# Patient Record
Sex: Female | Born: 1975 | Race: White | Hispanic: No | Marital: Married | Smoking: Never smoker
Health system: Southern US, Community
[De-identification: ages and names within clinical notes are randomized; demographics above are authoritative.]

## PROBLEM LIST (undated history)

## (undated) DIAGNOSIS — N6002 Solitary cyst of left breast: Secondary | ICD-10-CM

## (undated) DIAGNOSIS — R7303 Prediabetes: Secondary | ICD-10-CM

## (undated) DIAGNOSIS — D649 Anemia, unspecified: Secondary | ICD-10-CM

## (undated) DIAGNOSIS — R569 Unspecified convulsions: Secondary | ICD-10-CM

## (undated) HISTORY — PX: ABDOMINAL HYSTERECTOMY: SHX81

## (undated) HISTORY — PX: TUBAL LIGATION: SHX77

## (undated) HISTORY — DX: Solitary cyst of left breast: N60.02

---

## 2007-07-23 ENCOUNTER — Ambulatory Visit: Payer: Self-pay | Admitting: Obstetrics and Gynecology

## 2007-07-26 ENCOUNTER — Inpatient Hospital Stay: Payer: Self-pay | Admitting: Obstetrics and Gynecology

## 2013-05-20 ENCOUNTER — Ambulatory Visit: Payer: Self-pay | Admitting: Obstetrics and Gynecology

## 2013-05-24 ENCOUNTER — Ambulatory Visit: Payer: Self-pay | Admitting: Obstetrics and Gynecology

## 2013-11-08 ENCOUNTER — Ambulatory Visit: Payer: Self-pay | Admitting: Neurology

## 2014-01-11 ENCOUNTER — Ambulatory Visit: Payer: Self-pay | Admitting: Obstetrics and Gynecology

## 2014-03-08 DIAGNOSIS — N83209 Unspecified ovarian cyst, unspecified side: Secondary | ICD-10-CM | POA: Insufficient documentation

## 2014-03-08 DIAGNOSIS — N926 Irregular menstruation, unspecified: Secondary | ICD-10-CM | POA: Insufficient documentation

## 2014-05-22 ENCOUNTER — Ambulatory Visit: Payer: Self-pay | Admitting: Obstetrics and Gynecology

## 2016-02-05 ENCOUNTER — Ambulatory Visit
Admission: EM | Admit: 2016-02-05 | Discharge: 2016-02-05 | Disposition: A | Discharge status: Home / Self Care | Payer: Self-pay | Attending: Family Medicine | Admitting: Family Medicine

## 2016-02-05 MED ORDER — TUBERCULIN PPD 5 UNIT/0.1ML ID SOLN
5.0000 [IU] | Freq: Once | INTRADERMAL | Status: DC
  Administered 2016-02-05: 5 [IU] via INTRADERMAL

## 2016-02-05 NOTE — ED Notes (Signed)
For PPD Placement-starts working at [employer] March 1st/17.

## 2016-02-08 NOTE — ED Notes (Signed)
PPD read- 0 mm (negative) LFA. Read by this nurse

## 2016-04-25 ENCOUNTER — Other Ambulatory Visit: Payer: Self-pay | Admitting: Obstetrics and Gynecology

## 2016-04-25 DIAGNOSIS — N6459 Other signs and symptoms in breast: Secondary | ICD-10-CM

## 2016-05-05 ENCOUNTER — Ambulatory Visit: Payer: Self-pay

## 2016-05-05 ENCOUNTER — Other Ambulatory Visit: Payer: Self-pay

## 2016-05-13 ENCOUNTER — Ambulatory Visit
Admission: RE | Admit: 2016-05-13 | Discharge: 2016-05-13 | Disposition: A | Discharge status: Home / Self Care | Payer: Managed Care, Other (non HMO) | Source: Ambulatory Visit | Attending: Obstetrics and Gynecology | Admitting: Obstetrics and Gynecology

## 2016-05-13 DIAGNOSIS — N6459 Other signs and symptoms in breast: Secondary | ICD-10-CM

## 2016-05-13 DIAGNOSIS — N63 Unspecified lump in breast: Secondary | ICD-10-CM | POA: Insufficient documentation

## 2016-05-22 ENCOUNTER — Other Ambulatory Visit: Payer: Self-pay | Admitting: Obstetrics and Gynecology

## 2016-05-22 DIAGNOSIS — N63 Unspecified lump in unspecified breast: Secondary | ICD-10-CM

## 2016-11-17 ENCOUNTER — Ambulatory Visit
Admission: RE | Admit: 2016-11-17 | Discharge: 2016-11-17 | Disposition: A | Discharge status: Home / Self Care | Payer: Managed Care, Other (non HMO) | Source: Ambulatory Visit | Attending: Obstetrics and Gynecology | Admitting: Obstetrics and Gynecology

## 2016-11-17 DIAGNOSIS — N6320 Unspecified lump in the left breast, unspecified quadrant: Secondary | ICD-10-CM | POA: Insufficient documentation

## 2016-11-17 DIAGNOSIS — N63 Unspecified lump in unspecified breast: Secondary | ICD-10-CM

## 2016-12-09 ENCOUNTER — Other Ambulatory Visit: Payer: Self-pay | Admitting: Obstetrics and Gynecology

## 2016-12-09 DIAGNOSIS — N6002 Solitary cyst of left breast: Secondary | ICD-10-CM

## 2016-12-22 HISTORY — PX: BREAST CYST ASPIRATION: SHX578

## 2017-05-19 ENCOUNTER — Ambulatory Visit: Payer: Managed Care, Other (non HMO)

## 2017-05-26 ENCOUNTER — Encounter
Admission: RE | Admit: 2017-05-26 | Discharge: 2017-05-26 | Disposition: A | Discharge status: Home / Self Care | Payer: BC Managed Care – PPO | Source: Ambulatory Visit | Attending: Obstetrics and Gynecology | Admitting: Obstetrics and Gynecology

## 2017-05-26 DIAGNOSIS — Z01812 Encounter for preprocedural laboratory examination: Secondary | ICD-10-CM | POA: Insufficient documentation

## 2017-05-26 DIAGNOSIS — N879 Dysplasia of cervix uteri, unspecified: Secondary | ICD-10-CM | POA: Diagnosis not present

## 2017-05-26 DIAGNOSIS — N920 Excessive and frequent menstruation with regular cycle: Secondary | ICD-10-CM | POA: Diagnosis not present

## 2017-05-26 DIAGNOSIS — Z0183 Encounter for blood typing: Secondary | ICD-10-CM | POA: Insufficient documentation

## 2017-05-26 HISTORY — DX: Prediabetes: R73.03

## 2017-05-26 HISTORY — DX: Unspecified convulsions: R56.9

## 2017-05-26 HISTORY — DX: Anemia, unspecified: D64.9

## 2017-05-26 LAB — CBC
HCT: 30.2 % — ABNORMAL LOW (ref 35.0–47.0)
Hemoglobin: 9.2 g/dL — ABNORMAL LOW (ref 12.0–16.0)
MCH: 19 pg — ABNORMAL LOW (ref 26.0–34.0)
MCHC: 30.4 g/dL — ABNORMAL LOW (ref 32.0–36.0)
MCV: 62.4 fL — ABNORMAL LOW (ref 80.0–100.0)
PLATELETS: 408 10*3/uL (ref 150–440)
RBC: 4.83 MIL/uL (ref 3.80–5.20)
RDW: 19.1 % — ABNORMAL HIGH (ref 11.5–14.5)
WBC: 6.2 10*3/uL (ref 3.6–11.0)

## 2017-05-26 LAB — TYPE AND SCREEN
ABO/RH(D): O POS
Antibody Screen: NEGATIVE

## 2017-05-26 LAB — BASIC METABOLIC PANEL
Anion gap: 4 — ABNORMAL LOW (ref 5–15)
BUN: 7 mg/dL (ref 6–20)
CALCIUM: 8.9 mg/dL (ref 8.9–10.3)
CO2: 28 mmol/L (ref 22–32)
CREATININE: 0.62 mg/dL (ref 0.44–1.00)
Chloride: 106 mmol/L (ref 101–111)
GFR calc non Af Amer: >60 mL/min (ref >60)
Glucose, Bld: 89 mg/dL (ref 65–99)
Potassium: 3.9 mmol/L (ref 3.5–5.1)
SODIUM: 138 mmol/L (ref 135–145)

## 2017-05-26 NOTE — H&P (Signed)
[name] is a 41 y.o. female here for TAh and bilateral salpingectomy . Marland KitchenThe patient has a long h/o bleeding for 10 days / month and passes clots . SIS and EMBX done 04/2016 all nl . Pt with cervical dysplasia cin1 . She is s/p 2 c/s and no vag deliveries . Intermittent dyspareunia . Also with recurrent vaginal  monilial infections . Treated last time with 6 months of diflucan . Borderline diabetic  Past Medical History:  has a past medical history of Abnormal Pap smear of cervix; Atypical squamous cells of undetermined significance (ASCUS) on Papanicolaou smear of cervix (05/06/2011); Cervical dysplasia; Dysplasia of cervix, low grade (CIN 1); History of anemia; History of miscarriage; History of seizure disorder; History of UTI; HSV infection; Irregular menstrual cycle, unspecified (03/08/2014); Other and unspecified ovarian cyst (03/08/2014); and Prediabetes.  Past Surgical History:  has a past surgical history that includes Tubal ligation; Colposcopy; and Cesarean section (2003, 2008). Family History: family history includes Celiac disease in her father; Diabetes in her mother and other; High blood pressure (Hypertension) in her mother; Hyperlipidemia (Elevated cholesterol) in her mother; Ovarian cancer in her mother; Urinary Incontinence in her mother. Social History:  reports that she has never smoked. She has never used smokeless tobacco. She reports that she does not drink alcohol or use drugs. OB/GYN History:          OB History    Gravida Para Term Preterm AB Living   3 2 2   1 2    SAB TAB Ectopic Molar Multiple Live Births   1                Allergies: is allergic to iodinated contrast- oral and iv dye. Medications:  Current Outpatient Prescriptions:  .  fluconazole (DIFLUCAN) 150 MG tablet, Take 1 tablet (150 mg total) by mouth once. May repeat tablet in 1 week, Disp: 1 tablet, Rfl: 1 .  fluconazole (DIFLUCAN) 150 MG tablet, Take 1 tablet (150 mg total) by mouth once daily. For  three days., Disp: 3 tablet, Rfl: 0 .  fluconazole (DIFLUCAN) 150 MG tablet, Take 1 tablet by mouth once weekly for 6 months for suppression, Disp: 60 tablet, Rfl: 3  Review of Systems: General:                      No fatigue or weight loss Eyes:                           No vision changes Ears:                            No hearing difficulty Respiratory:                No cough or shortness of breath Pulmonary:                  No asthma or shortness of breath Cardiovascular:           No chest pain, palpitations, dyspnea on exertion Gastrointestinal:          No abdominal bloating, chronic diarrhea, constipations, masses, pain or hematochezia Genitourinary:             No hematuria, dysuria, abnormal vaginal discharge, pelvic pain, Menometrorrhagia Lymphatic:                   No swollen lymph nodes Musculoskeletal:  No muscle weakness Neurologic:                  No extremity weakness, syncope, seizure disorder Psychiatric:                  No history of depression, delusions or suicidal/homicidal ideation    Exam:      Vitals:   04/08/17 1051  BP: 127/70  Pulse: 78    Body mass index is 27.72 kg/m.  WDWN white/  female in NAD   Lungs: CTA  CV : RRR without murmur   Neck:  no thyromegaly Abdomen: soft , no mass, normal active bowel sounds,  non-tender, no rebound tenderness Pelvic: tanner stage 5 ,  External genitalia: vulva /labia no lesions Urethra: no prolapse Vagina: white d/c . Wet mount : + yeast  Cervix: no lesions, no cervical motion tenderness   Uterus: normal size shape and contour, non-tender Adnexa: no mass,  non-tender   Rectovaginal:   Impression:   The primary encounter diagnosis was Menorrhagia with regular cycle. Diagnoses of Leukorrhea, History of cervical dysplasia, and Monilial vaginitis were also pertinent to this visit.( recurrent)    Plan:    I have spoken with the patient regarding treatment options including  expectant management, hormonal options, or surgical intervention. After a full discussion the pt elects to proceed with TAH   Benefits and risks to surgery:TAH , bilateral salpingectomy  The proposed benefit of the surgery has been discussed with the patient. The possible risks include, but are not limited to: organ injury to the bowel , bladder, ureters, and major blood vessels and nerves. There is a possibility of additional surgeries resulting from these injuries. There is also the risk of blood transfusion and the need to receive blood products during or after the procedure which may rarely lead to HIV or Hepatitis C infection. There is a risk of developing a deep venous thrombosis or a pulmonary embolism . There is the possibility of wound infection and also anesthetic complications, even the rare possibility of death. The patient understands these risks and wishes to proceed. All questions have been answered and the consent has been signed.   Orders Placed This Encounter  Procedures  . Yeast Only, Culture - [facility]  . Wet Prep    Return if symptoms worsen or fail to improve, for preop.  Caroline Sauger, MD       Electronically signed by Caroline Sauger, MD

## 2017-05-26 NOTE — Patient Instructions (Signed)
  Your procedure is scheduled on: June 05, 2017 (FRIDAY) Report to Same Day Surgery 2nd floor medical mall ([place] Entrance-take elevator on left to 2nd floor.  Check in with surgery information desk.) To find out your arrival time please call [phone] between 1PM - 3PM on June 04, 2017 (THURSDAY)  Remember: Instructions that are not followed completely may result in serious medical risk, up to and including death, or upon the discretion of your surgeon and anesthesiologist your surgery may need to be rescheduled.    _x___ 1. Do not eat food or drink liquids after midnight. No gum chewing or hard candies                                 __x__ 2. No Alcohol for 24 hours before or after surgery.   __x__3. No Smoking for 24 prior to surgery.   ____  4. Bring all medications with you on the day of surgery if instructed.    __x__ 5. Notify your doctor if there is any change in your medical condition     (cold, fever, infections).     Do not wear jewelry, make-up, hairpins, clips or nail polish.  Do not wear lotions, powders, or perfumes.  Do not shave 48 hours prior to surgery. Men may shave face and neck.  Do not bring valuables to the hospital.    [facility] is not responsible for any belongings or valuables.               Contacts, dentures or bridgework may not be worn into surgery.  Leave your suitcase in the car. After surgery it may be brought to your room.  For patients admitted to the hospital, discharge time is determined by your  treatment team                    Patients discharged the day of surgery will not be allowed to drive home.  You will need someone to drive you home and stay with you the night of your procedure.    Please read over the following fact sheets that you were given:   [facility] Preparing for Surgery and or MRSA Information   ___ Take anti-hypertensive (unless it includes a diuretic), cardiac, seizure, asthma,     anti-reflux and psychiatric  medicines. These include:  1.   2.  3.  4.  5.  6.  _x__Fleets enema or Magnesium Citrate as directed. (FLEET ENEMA EARLY MORNING OF SURGERY PRIOR TO COMING TO HOSPITAL)   _x___ Use CHG Soap or sage wipes as directed on instruction sheet   ____ Use inhalers on the day of surgery and bring to hospital day of surgery  ____ Stop Metformin and Janumet 2 days prior to surgery.    ____ Take 1/2 of usual insulin dose the night before surgery and none on the morning surgery     _x___ Follow recommendations from Cardiologist, Pulmonologist or PCP regarding          stopping Aspirin, Coumadin, Plavix ,Eliquis, Effient, or Pradaxa, and Pletal.  X____Stop Anti-inflammatories such as Advil, Aleve, Ibuprofen, Motrin, Naproxen, Naprosyn, Goodies powders or aspirin products. OK to take Tylenol   _x___ Stop supplements until after surgery.  But may continue Vitamin D, Vitamin B, and multivitamin      ____ Bring C-Pap to the hospital.

## 2017-06-03 ENCOUNTER — Other Ambulatory Visit: Payer: Self-pay | Admitting: Obstetrics and Gynecology

## 2017-06-03 ENCOUNTER — Ambulatory Visit
Admission: RE | Admit: 2017-06-03 | Discharge: 2017-06-03 | Disposition: A | Discharge status: Home / Self Care | Payer: BC Managed Care – PPO | Source: Ambulatory Visit | Attending: Obstetrics and Gynecology | Admitting: Obstetrics and Gynecology

## 2017-06-03 ENCOUNTER — Ambulatory Visit: Payer: Managed Care, Other (non HMO)

## 2017-06-03 DIAGNOSIS — N6002 Solitary cyst of left breast: Secondary | ICD-10-CM

## 2017-06-05 ENCOUNTER — Encounter
Admission: RE | Disposition: A | Discharge status: Home / Self Care | Payer: Self-pay | Source: Ambulatory Visit | Attending: Obstetrics and Gynecology

## 2017-06-05 ENCOUNTER — Inpatient Hospital Stay: Payer: BC Managed Care – PPO | Admitting: Anesthesiology

## 2017-06-05 ENCOUNTER — Inpatient Hospital Stay
Admission: RE | Admit: 2017-06-05 | Discharge: 2017-06-08 | DRG: 743 | Disposition: A | Discharge status: Home / Self Care | Payer: BC Managed Care – PPO | Source: Ambulatory Visit | Attending: Obstetrics and Gynecology | Admitting: Obstetrics and Gynecology

## 2017-06-05 ENCOUNTER — Encounter: Payer: Self-pay | Admitting: *Deleted

## 2017-06-05 DIAGNOSIS — N920 Excessive and frequent menstruation with regular cycle: Secondary | ICD-10-CM | POA: Diagnosis present

## 2017-06-05 DIAGNOSIS — N898 Other specified noninflammatory disorders of vagina: Secondary | ICD-10-CM | POA: Diagnosis present

## 2017-06-05 DIAGNOSIS — N879 Dysplasia of cervix uteri, unspecified: Secondary | ICD-10-CM | POA: Diagnosis present

## 2017-06-05 DIAGNOSIS — Z9889 Other specified postprocedural states: Secondary | ICD-10-CM

## 2017-06-05 HISTORY — PX: HYSTERECTOMY ABDOMINAL WITH SALPINGECTOMY: SHX6725

## 2017-06-05 LAB — GLUCOSE, CAPILLARY: Glucose-Capillary: 88 mg/dL (ref 65–99)

## 2017-06-05 LAB — ABO/RH: ABO/RH(D): O POS

## 2017-06-05 LAB — POCT PREGNANCY, URINE: PREG TEST UR: NEGATIVE

## 2017-06-05 SURGERY — HYSTERECTOMY, TOTAL, ABDOMINAL, WITH SALPINGECTOMY
Anesthesia: General | Laterality: Bilateral

## 2017-06-05 MED ORDER — GLYCOPYRROLATE 0.2 MG/ML IJ SOLN
INTRAMUSCULAR | Status: DC | PRN
  Administered 2017-06-05 (×2): 0.1 mg via INTRAVENOUS

## 2017-06-05 MED ORDER — KETOROLAC TROMETHAMINE 30 MG/ML IJ SOLN
INTRAMUSCULAR | Status: DC | PRN
  Administered 2017-06-05: 15 mg via INTRAVENOUS

## 2017-06-05 MED ORDER — DEXAMETHASONE SODIUM PHOSPHATE 10 MG/ML IJ SOLN
INTRAMUSCULAR | Status: AC
  Filled 2017-06-05: qty 1

## 2017-06-05 MED ORDER — HYDROMORPHONE HCL 1 MG/ML IJ SOLN
0.5000 mg | INTRAMUSCULAR | Status: AC | PRN
  Administered 2017-06-05 (×2): 0.5 mg via INTRAVENOUS

## 2017-06-05 MED ORDER — FENTANYL CITRATE (PF) 100 MCG/2ML IJ SOLN
INTRAMUSCULAR | Status: AC
  Filled 2017-06-05: qty 2

## 2017-06-05 MED ORDER — MORPHINE SULFATE (PF) 2 MG/ML IV SOLN: 1.0000 mg | INTRAVENOUS | Status: DC | PRN

## 2017-06-05 MED ORDER — SUCCINYLCHOLINE CHLORIDE 20 MG/ML IJ SOLN
INTRAMUSCULAR | Status: AC
  Filled 2017-06-05: qty 1

## 2017-06-05 MED ORDER — SODIUM CHLORIDE 0.9 % IJ SOLN
INTRAMUSCULAR | Status: AC
  Filled 2017-06-05: qty 10

## 2017-06-05 MED ORDER — FAMOTIDINE 20 MG PO TABS
ORAL_TABLET | ORAL | Status: AC
  Filled 2017-06-05: qty 1

## 2017-06-05 MED ORDER — PHENYLEPHRINE HCL 10 MG/ML IJ SOLN
INTRAMUSCULAR | Status: AC
  Filled 2017-06-05: qty 1

## 2017-06-05 MED ORDER — BUPIVACAINE HCL (PF) 0.5 % IJ SOLN
INTRAMUSCULAR | Status: DC | PRN
  Administered 2017-06-05: 30 mL
  Administered 2017-06-05: 20 mL

## 2017-06-05 MED ORDER — ONDANSETRON HCL 4 MG/2ML IJ SOLN
INTRAMUSCULAR | Status: AC
  Filled 2017-06-05: qty 2

## 2017-06-05 MED ORDER — FAMOTIDINE 20 MG PO TABS
20.0000 mg | ORAL_TABLET | Freq: Once | ORAL | Status: AC
  Administered 2017-06-05: 20 mg via ORAL

## 2017-06-05 MED ORDER — PROPOFOL 10 MG/ML IV BOLUS
INTRAVENOUS | Status: AC
  Filled 2017-06-05: qty 20

## 2017-06-05 MED ORDER — DEXAMETHASONE SODIUM PHOSPHATE 10 MG/ML IJ SOLN
INTRAMUSCULAR | Status: DC | PRN
  Administered 2017-06-05: 5 mg via INTRAVENOUS

## 2017-06-05 MED ORDER — OXYCODONE-ACETAMINOPHEN 5-325 MG PO TABS
1.0000 | ORAL_TABLET | ORAL | Status: DC | PRN
Start: 2017-06-05 — End: 2017-06-08
  Administered 2017-06-05 – 2017-06-07 (×4): 1 via ORAL
  Filled 2017-06-05: qty 2
  Filled 2017-06-05 (×3): qty 1

## 2017-06-05 MED ORDER — SIMETHICONE 80 MG PO CHEW
80.0000 mg | CHEWABLE_TABLET | Freq: Four times a day (QID) | ORAL | Status: DC | PRN
Start: 2017-06-05 — End: 2017-06-08
  Administered 2017-06-05 – 2017-06-07 (×7): 80 mg via ORAL
  Filled 2017-06-05 (×7): qty 1

## 2017-06-05 MED ORDER — FENTANYL CITRATE (PF) 100 MCG/2ML IJ SOLN
INTRAMUSCULAR | Status: AC
  Administered 2017-06-05: 25 ug via INTRAVENOUS
  Filled 2017-06-05: qty 2

## 2017-06-05 MED ORDER — BUPIVACAINE LIPOSOME 1.3 % IJ SUSP
INTRAMUSCULAR | Status: AC
  Filled 2017-06-05: qty 20

## 2017-06-05 MED ORDER — ROCURONIUM BROMIDE 50 MG/5ML IV SOLN
INTRAVENOUS | Status: AC
  Filled 2017-06-05: qty 1

## 2017-06-05 MED ORDER — EPHEDRINE SULFATE 50 MG/ML IJ SOLN
INTRAMUSCULAR | Status: AC
  Filled 2017-06-05: qty 1

## 2017-06-05 MED ORDER — HYDROMORPHONE HCL 1 MG/ML PO LIQD: 0.5000 mg | ORAL | Status: DC | PRN

## 2017-06-05 MED ORDER — LACTATED RINGERS IV SOLN
INTRAVENOUS | Status: DC
  Administered 2017-06-05 (×2): via INTRAVENOUS

## 2017-06-05 MED ORDER — ONDANSETRON HCL 4 MG/2ML IJ SOLN: 4.0000 mg | Freq: Once | INTRAMUSCULAR | Status: DC | PRN

## 2017-06-05 MED ORDER — ONDANSETRON HCL 4 MG/2ML IJ SOLN
4.0000 mg | Freq: Four times a day (QID) | INTRAMUSCULAR | Status: DC | PRN
  Administered 2017-06-05 (×2): 4 mg via INTRAVENOUS
  Filled 2017-06-05 (×2): qty 2

## 2017-06-05 MED ORDER — LIDOCAINE HCL (CARDIAC) 20 MG/ML IV SOLN
INTRAVENOUS | Status: DC | PRN
  Administered 2017-06-05: 100 mg via INTRAVENOUS

## 2017-06-05 MED ORDER — CEFOXITIN SODIUM-DEXTROSE 2-2.2 GM-% IV SOLR (PREMIX)
INTRAVENOUS | Status: AC
  Filled 2017-06-05: qty 50

## 2017-06-05 MED ORDER — FENTANYL CITRATE (PF) 100 MCG/2ML IJ SOLN
INTRAMUSCULAR | Status: DC | PRN
  Administered 2017-06-05 (×4): 50 ug via INTRAVENOUS

## 2017-06-05 MED ORDER — GLYCOPYRROLATE 0.2 MG/ML IJ SOLN
INTRAMUSCULAR | Status: AC
  Filled 2017-06-05: qty 1

## 2017-06-05 MED ORDER — SUGAMMADEX SODIUM 200 MG/2ML IV SOLN
INTRAVENOUS | Status: AC
  Filled 2017-06-05: qty 2

## 2017-06-05 MED ORDER — SODIUM CHLORIDE 0.9 % IV SOLN
INTRAVENOUS | Status: DC | PRN
  Administered 2017-06-05: 70 mL

## 2017-06-05 MED ORDER — FLEET ENEMA 7-19 GM/118ML RE ENEM: 1.0000 | ENEMA | Freq: Once | RECTAL | Status: DC

## 2017-06-05 MED ORDER — ACETAMINOPHEN 10 MG/ML IV SOLN
INTRAVENOUS | Status: DC | PRN
Start: 2017-06-05 — End: 2017-06-05
  Administered 2017-06-05: 1000 mg via INTRAVENOUS

## 2017-06-05 MED ORDER — SEVOFLURANE IN SOLN
RESPIRATORY_TRACT | Status: AC
  Filled 2017-06-05: qty 250

## 2017-06-05 MED ORDER — SODIUM CHLORIDE 0.9% FLUSH: 3.0000 mL | INTRAVENOUS | Status: DC | PRN

## 2017-06-05 MED ORDER — PROPOFOL 10 MG/ML IV BOLUS
INTRAVENOUS | Status: DC | PRN
  Administered 2017-06-05: 150 mg via INTRAVENOUS

## 2017-06-05 MED ORDER — MIDAZOLAM HCL 2 MG/2ML IJ SOLN
INTRAMUSCULAR | Status: AC
  Filled 2017-06-05: qty 2

## 2017-06-05 MED ORDER — SUGAMMADEX SODIUM 200 MG/2ML IV SOLN
INTRAVENOUS | Status: DC | PRN
  Administered 2017-06-05: 100 mg via INTRAVENOUS

## 2017-06-05 MED ORDER — KETOROLAC TROMETHAMINE 30 MG/ML IJ SOLN
INTRAMUSCULAR | Status: AC
  Filled 2017-06-05: qty 1

## 2017-06-05 MED ORDER — FENTANYL CITRATE (PF) 100 MCG/2ML IJ SOLN
25.0000 ug | INTRAMUSCULAR | Status: AC | PRN
  Administered 2017-06-05 (×6): 25 ug via INTRAVENOUS

## 2017-06-05 MED ORDER — SODIUM CHLORIDE 0.9 % IJ SOLN
INTRAMUSCULAR | Status: AC
  Filled 2017-06-05: qty 50

## 2017-06-05 MED ORDER — CEFOXITIN SODIUM-DEXTROSE 2-2.2 GM-% IV SOLR (PREMIX)
2.0000 g | INTRAVENOUS | Status: AC
  Administered 2017-06-05: 2 g via INTRAVENOUS

## 2017-06-05 MED ORDER — ROCURONIUM BROMIDE 100 MG/10ML IV SOLN
INTRAVENOUS | Status: DC | PRN
  Administered 2017-06-05: 20 mg via INTRAVENOUS
  Administered 2017-06-05: 30 mg via INTRAVENOUS

## 2017-06-05 MED ORDER — BUPIVACAINE HCL (PF) 0.5 % IJ SOLN
INTRAMUSCULAR | Status: AC
  Filled 2017-06-05: qty 30

## 2017-06-05 MED ORDER — MIDAZOLAM HCL 2 MG/2ML IJ SOLN
INTRAMUSCULAR | Status: DC | PRN
  Administered 2017-06-05: 2 mg via INTRAVENOUS

## 2017-06-05 MED ORDER — KETOROLAC TROMETHAMINE 30 MG/ML IJ SOLN
30.0000 mg | Freq: Three times a day (TID) | INTRAMUSCULAR | Status: AC | PRN
  Administered 2017-06-05 – 2017-06-06 (×3): 30 mg via INTRAVENOUS
  Filled 2017-06-05 (×3): qty 1

## 2017-06-05 MED ORDER — EPHEDRINE SULFATE 50 MG/ML IJ SOLN
INTRAMUSCULAR | Status: DC | PRN
  Administered 2017-06-05: 5 mg via INTRAVENOUS
  Administered 2017-06-05: 10 mg via INTRAVENOUS

## 2017-06-05 MED ORDER — ONDANSETRON HCL 4 MG PO TABS
4.0000 mg | ORAL_TABLET | Freq: Four times a day (QID) | ORAL | Status: DC | PRN
  Administered 2017-06-05: 4 mg via ORAL
  Filled 2017-06-05: qty 1

## 2017-06-05 MED ORDER — LIDOCAINE HCL (PF) 2 % IJ SOLN
INTRAMUSCULAR | Status: AC
  Filled 2017-06-05: qty 2

## 2017-06-05 MED ORDER — HYDROMORPHONE HCL 1 MG/ML IJ SOLN
INTRAMUSCULAR | Status: AC
  Administered 2017-06-05: 0.5 mg via INTRAVENOUS
  Filled 2017-06-05: qty 1

## 2017-06-05 MED ORDER — LACTATED RINGERS IV SOLN
INTRAVENOUS | Status: DC
  Administered 2017-06-05: 07:00:00 via INTRAVENOUS

## 2017-06-05 MED ORDER — ONDANSETRON HCL 4 MG/2ML IJ SOLN
INTRAMUSCULAR | Status: DC | PRN
  Administered 2017-06-05: 4 mg via INTRAVENOUS

## 2017-06-05 MED ORDER — BUTORPHANOL TARTRATE 2 MG/ML IJ SOLN: 2.0000 mg | INTRAMUSCULAR | Status: DC | PRN

## 2017-06-05 SURGICAL SUPPLY — 42 items
CANISTER SUCT 1200ML W/VALVE (MISCELLANEOUS) ×3 IMPLANT
CATH TRAY 16F METER LATEX (MISCELLANEOUS) ×3 IMPLANT
CHLORAPREP W/TINT 26ML (MISCELLANEOUS) ×3 IMPLANT
DRAPE LAP W/FLUID (DRAPES) ×3 IMPLANT
DRAPE UNDER BUTTOCK W/FLU (DRAPES) ×3 IMPLANT
DRSG TELFA 3X8 NADH (GAUZE/BANDAGES/DRESSINGS) ×3 IMPLANT
ELECT BLADE 6.5 EXT (BLADE) ×3 IMPLANT
ELECT CAUTERY BLADE 6.4 (BLADE) ×3 IMPLANT
ELECT REM PT RETURN 9FT ADLT (ELECTROSURGICAL) ×3
ELECTRODE REM PT RTRN 9FT ADLT (ELECTROSURGICAL) ×1 IMPLANT
GAUZE SPONGE 4X4 12PLY STRL (GAUZE/BANDAGES/DRESSINGS) ×3 IMPLANT
GLOVE BIO SURGEON STRL SZ7 (GLOVE) ×3 IMPLANT
GLOVE BIO SURGEON STRL SZ8 (GLOVE) ×30 IMPLANT
GLOVE BIOGEL PI IND STRL 6.5 (GLOVE) ×1 IMPLANT
GLOVE BIOGEL PI INDICATOR 6.5 (GLOVE) ×2
GOWN STRL REUS W/ TWL LRG LVL3 (GOWN DISPOSABLE) ×2 IMPLANT
GOWN STRL REUS W/ TWL XL LVL3 (GOWN DISPOSABLE) ×1 IMPLANT
GOWN STRL REUS W/TWL LRG LVL3 (GOWN DISPOSABLE) ×4
GOWN STRL REUS W/TWL XL LVL3 (GOWN DISPOSABLE) ×2
KIT RM TURNOVER CYSTO AR (KITS) ×3 IMPLANT
LABEL OR SOLS (LABEL) ×3 IMPLANT
NEEDLE HYPO 22GX1.5 SAFETY (NEEDLE) ×6 IMPLANT
PACK BASIN MAJOR ARMC (MISCELLANEOUS) ×3 IMPLANT
RETAINER VISCERA MED (MISCELLANEOUS) ×3 IMPLANT
SOL PREP PVP 2OZ (MISCELLANEOUS) ×3
SOLUTION PREP PVP 2OZ (MISCELLANEOUS) ×1 IMPLANT
SPONGE XRAY 4X4 16PLY STRL (MISCELLANEOUS) ×3 IMPLANT
STAPLER INSORB 30 2030 C-SECTI (MISCELLANEOUS) ×3 IMPLANT
STAPLER SKIN PROX 35W (STAPLE) IMPLANT
SURGILUBE 2OZ TUBE FLIPTOP (MISCELLANEOUS) ×3 IMPLANT
SUT CHROMIC 3 0 SH 27 (SUTURE) ×6 IMPLANT
SUT PDS AB 1 TP1 96 (SUTURE) IMPLANT
SUT VIC AB 0 CT1 27 (SUTURE)
SUT VIC AB 0 CT1 27XCR 8 STRN (SUTURE) IMPLANT
SUT VIC AB 0 CT1 36 (SUTURE) ×9 IMPLANT
SUT VIC AB 2-0 SH 27 (SUTURE) ×8
SUT VIC AB 2-0 SH 27XBRD (SUTURE) ×4 IMPLANT
SUT VICRYL PLUS ABS 0 54 (SUTURE) ×3 IMPLANT
SYR 30ML LL (SYRINGE) ×6 IMPLANT
SYR BULB IRRIG 60ML STRL (SYRINGE) ×3 IMPLANT
TRAY PREP VAG/GEN (MISCELLANEOUS) ×3 IMPLANT
WATER STERILE IRR 1000ML POUR (IV SOLUTION) ×3 IMPLANT

## 2017-06-05 NOTE — Anesthesia Postprocedure Evaluation (Signed)
Anesthesia Post Note  Patient: [name]  Procedure(s) Performed: Procedure(s) (LRB): HYSTERECTOMY ABDOMINAL WITH SALPINGECTOMY (Bilateral)  Patient location during evaluation: PACU Anesthesia Type: General Level of consciousness: awake and alert Pain management: pain level controlled Vital Signs Assessment: post-procedure vital signs reviewed and stable Respiratory status: spontaneous breathing and respiratory function stable Cardiovascular status: stable Anesthetic complications: no     Last Vitals:  Vitals:   06/05/17 0954 06/05/17 1010  BP: 111/64 (!) 106/59  Pulse: (!) 57 (!) 53  Resp: (!) 22 (!) 35  Temp:      Last Pain:  Vitals:   06/05/17 1015  TempSrc:   PainSc: 5                  KEPHART,WILLIAM K

## 2017-06-05 NOTE — Op Note (Signed)
NAMEJONALYN, [name]                    ACCOUNT NO.:  [id number]  MEDICAL RECORD NO.:  [id number]  LOCATION:                               FACILITY:  [facility]  PHYSICIAN:  Laverta Baltimore, MDDATE OF BIRTH:  1976-04-20  DATE OF PROCEDURE:  06/05/2017 DATE OF DISCHARGE:                              OPERATIVE REPORT   PREOPERATIVE DIAGNOSIS: 1. Menorrhagia. 2. Cervical dysplasia.  POSTOPERATIVE DIAGNOSIS: 1. Menorrhagia. 2. Cervical dysplasia.  PROCEDURE PERFORMED: 1. Total abdominal hysterectomy. 2. Bilateral salpingectomy.  SURGEON:  Laverta Baltimore, MD  ANESTHESIA:  General endotracheal anesthesia.  SURGEON:  Laverta Baltimore, MD.  FIRST ASSISTANT:  Dr. Leafy Ro.  SECOND ASSISTANT:  [place] student, [place].  DESCRIPTION OF PROCEDURE:  The patient was prepped and draped in a normal sterile fashion.  The patient did receive 2 g IV cefoxitin prior to commencement of the case.  A time-out was performed.  A Pfannenstiel incision was made 2 fingerbreadths above the symphysis pubis.  Sharp dissection was used to identify the fascia.  Fascia was opened in the midline and opened in a transverse fashion.  The superior aspect of the fascia was grasped with Kocher clamps and the recti muscles dissected free.  The inferior aspect of the fascia was grasped with Kocher clamps and pyramidalis muscle was dissected free.  Entry into the peritoneal cavity was accomplished sharply.  An O'Connor-O'Sullivan retractor was placed into the abdomen and the bowel was packed cephalad.  Two large Kelly clamps were placed on the cornua and were used for retraction. The round ligaments on both sides were clamped, transected, and suture ligated with 0 Vicryl suture.  Anterior leaf of the broad ligament was incised along the bladder reflection to the midline from both sides. The bladder was gently dissected off the lower uterine segment and the cervix with sharp dissection and utilizing a sponge stick.   The utero- ovarian ligament was then clamped bilaterally, transected, and suture ligated with 0 Vicryl suture.  Uterine arteries were skeletonized bilaterally, clamped with Haney clamps, transected, and suture ligated with 0 Vicryl suture.  Uterosacral ligaments were clamped on both sides, transected, suture ligated with 0 Vicryl suture.  Curved Haney clamps were then used to remove the cervix.  Pedicles were ligated and the vaginal cuff was closed with interrupted 0 Vicryl suture.  Good hemostasis was noted.  Each fallopian tube was grasped and clamped with Haney clamps and each fallopian tube was removed and the pedicles were ligated with 0 Vicryl suture.  Ureters were identified prior to commencement of the case and at the end of the case with good peristaltic activity.  The patient's abdomen was copiously irrigated. No active bleeding noted.  All tagged suture pedicles were removed.  All laparotomy sponges were removed.  Sponge and needle counts were correct. The fascia was then closed with 0 Vicryl suture in a running nonlocking fashion.  The fascial edge was then injected with a solution of 20 mL of 1.3% Exparel with 30 mL of 0.5% Marcaine and 50 mL of normal saline, 60 mL was used to inject the fascial line.  Subcutaneous tissues were irrigated and bovied and given the depth of  the subcutaneous tissue of 3 cm, the dead space was closed with a running 3-0 chromic suture.  The skin was reapproximated with Insorb absorbable staples.  There were no complications.  The patient tolerated the procedure well.  ESTIMATED BLOOD LOSS:  100 mL.  URINE OUTPUT:  500 mL.  INTRAOPERATIVE FLUIDS:  1000 mL.  DISPOSITION:  The patient was taken to the recovery room in good condition.          ______________________________ Laverta Baltimore, MD     TS/MEDQ  D:  06/05/2017  T:  06/05/2017  Job:  [id number]

## 2017-06-05 NOTE — Progress Notes (Signed)
Patient ID: [name], female   DOB: [birth year], 41 y.o.   MRN: [id number] DOS   small po clears  Some nausea O: VSS Perineum : no blood  Good urine output  A: Stable  P: d/c foley  Cont IV  Add Iv stadol prn

## 2017-06-05 NOTE — Progress Notes (Signed)
Pt is ready for TAH / bilateral salpingectomy. NPO . Labs nl  All questions answered

## 2017-06-05 NOTE — Brief Op Note (Signed)
06/05/2017 DOS :  06/05/17 9:31 AM  PATIENT:  Olena Mater Dechaine  41 y.o. female  PRE-OPERATIVE DIAGNOSIS:  Menorrhagia  Cervical Dysplasia  POST-OPERATIVE DIAGNOSIS:  Menorrhagia  Cervical Dysplasia  PROCEDURE:  Procedure(s): HYSTERECTOMY ABDOMINAL WITH SALPINGECTOMY (Bilateral)  SURGEON:  Surgeon(s) and Role:    * Schermerhorn, Gwen Her, MD - Primary    * Benjaman Kindler, MD  PHYSICIAN ASSISTANT: Carolynn Serve, [place] student   ASSISTANTS: none   ANESTHESIA:   general  EBL:  Total I/O In: 1000 [I.V.:1000] Out: 600 [Urine:500; Blood:100]  BLOOD ADMINISTERED:none  DRAINS: Urinary Catheter (Foley)   LOCAL MEDICATIONS USED:  MARCAINE   , BUPIVICAINE , NS  Amount: 100 ml  SPECIMEN:  Source of Specimen:  cervix, uterus and bilateral fallopian tubes   DISPOSITION OF SPECIMEN:  PATHOLOGY  COUNTS:  YES  TOURNIQUET:  * No tourniquets in log *  DICTATION: .Other Dictation: Dictation Number verbal  PLAN OF CARE: Admit to inpatient   PATIENT DISPOSITION:  PACU - hemodynamically stable.   Delay start of Pharmacological VTE agent (>24hrs) due to surgical blood loss or risk of bleeding: not applicable

## 2017-06-05 NOTE — Anesthesia Procedure Notes (Signed)
Procedure Name: Intubation Date/Time: 06/05/2017 7:37 AM Performed by: Zetta Bills Pre-anesthesia Checklist: Patient identified, Emergency Drugs available, Suction available and Patient being monitored Patient Re-evaluated:Patient Re-evaluated prior to inductionOxygen Delivery Method: Circle system utilized Preoxygenation: Pre-oxygenation with 100% oxygen Intubation Type: IV induction Ventilation: Mask ventilation without difficulty Laryngoscope Size: Mac and 3 Grade View: Grade II Tube type: Oral Tube size: 7.0 mm Number of attempts: 1 Airway Equipment and Method: Stylet Placement Confirmation: ETT inserted through vocal cords under direct vision Secured at: 20 cm Tube secured with: Tape Dental Injury: Teeth and Oropharynx as per pre-operative assessment

## 2017-06-05 NOTE — Anesthesia Preprocedure Evaluation (Signed)
Anesthesia Evaluation  Patient identified by MRN, date of birth, ID band Patient awake    Reviewed: Allergy & Precautions, NPO status , Patient's Chart, lab work & pertinent test results  History of Anesthesia Complications (+) PONV and history of anesthetic complications  Airway Mallampati: II       Dental   Pulmonary neg pulmonary ROS,           Cardiovascular negative cardio ROS       Neuro/Psych Seizures - (none for 24 years),     GI/Hepatic negative GI ROS, Neg liver ROS,   Endo/Other  negative endocrine ROS  Renal/GU negative Renal ROS     Musculoskeletal   Abdominal   Peds  Hematology  (+) anemia ,   Anesthesia Other Findings   Reproductive/Obstetrics                             Anesthesia Physical Anesthesia Plan  ASA: II  Anesthesia Plan: General   Post-op Pain Management:    Induction:   PONV Risk Score and Plan: 4 or greater and Ondansetron, Dexamethasone, Propofol, Midazolam and Scopolamine patch - Pre-op  Airway Management Planned: Oral ETT  Additional Equipment:   Intra-op Plan:   Post-operative Plan:   Informed Consent: I have reviewed the patients History and Physical, chart, labs and discussed the procedure including the risks, benefits and alternatives for the proposed anesthesia with the patient or authorized representative who has indicated his/her understanding and acceptance.     Plan Discussed with:   Anesthesia Plan Comments:         Anesthesia Quick Evaluation

## 2017-06-05 NOTE — Transfer of Care (Signed)
Immediate Anesthesia Transfer of Care Note  Patient: [name]  Procedure(s) Performed: Procedure(s): HYSTERECTOMY ABDOMINAL WITH SALPINGECTOMY (Bilateral)  Patient Location: PACU  Anesthesia Type:General  Level of Consciousness: awake  Airway & Oxygen Therapy: Patient connected to nasal cannula oxygen  Post-op Assessment: Report given to RN and Post -op Vital signs reviewed and stable  Post vital signs: stable  Last Vitals:  Vitals:   06/05/17 0628 06/05/17 0939  BP: 117/69   Pulse: 70   Resp: 16   Temp: 36.9 C (P) 36.4 C    Last Pain:  Vitals:   06/05/17 0628  TempSrc: Oral         Complications: No apparent anesthesia complications

## 2017-06-05 NOTE — Progress Notes (Signed)
Out of dept.

## 2017-06-05 NOTE — Anesthesia Post-op Follow-up Note (Cosign Needed)
Anesthesia QCDR form completed.        

## 2017-06-05 NOTE — Op Note (Deleted)
  The note originally documented on this encounter has been moved the the encounter in which it belongs.  

## 2017-06-06 LAB — CBC
HCT: 26.3 % — ABNORMAL LOW (ref 35.0–47.0)
Hemoglobin: 8.3 g/dL — ABNORMAL LOW (ref 12.0–16.0)
MCH: 19.5 pg — AB (ref 26.0–34.0)
MCHC: 31.8 g/dL — ABNORMAL LOW (ref 32.0–36.0)
MCV: 61.4 fL — AB (ref 80.0–100.0)
PLATELETS: 415 10*3/uL (ref 150–440)
RBC: 4.28 MIL/uL (ref 3.80–5.20)
RDW: 19.8 % — AB (ref 11.5–14.5)
WBC: 8.9 10*3/uL (ref 3.6–11.0)

## 2017-06-06 LAB — BASIC METABOLIC PANEL
Anion gap: 4 — ABNORMAL LOW (ref 5–15)
BUN: 7 mg/dL (ref 6–20)
CO2: 26 mmol/L (ref 22–32)
CREATININE: 0.68 mg/dL (ref 0.44–1.00)
Calcium: 8.6 mg/dL — ABNORMAL LOW (ref 8.9–10.3)
Chloride: 109 mmol/L (ref 101–111)
GFR calc Af Amer: >60 mL/min (ref >60)
GLUCOSE: 103 mg/dL — AB (ref 65–99)
POTASSIUM: 4.2 mmol/L (ref 3.5–5.1)
SODIUM: 139 mmol/L (ref 135–145)

## 2017-06-06 MED ORDER — IBUPROFEN 800 MG PO TABS
800.0000 mg | ORAL_TABLET | Freq: Three times a day (TID) | ORAL | Status: DC | PRN
  Administered 2017-06-06 – 2017-06-07 (×4): 800 mg via ORAL
  Filled 2017-06-06 (×4): qty 1

## 2017-06-06 NOTE — Progress Notes (Signed)
1 Day Post-Op Procedure(s) (LRB): HYSTERECTOMY ABDOMINAL WITH SALPINGECTOMY (Bilateral)  Subjective: Patient reports incisional pain and no problems voiding.   No flatus  Objective: I have reviewed patient's vital signs, intake and output and medications.  Lungs CTA  Cv RRR  adb soft , non distended ,  + BS  inicision covered  Results for orders placed or performed during the hospital encounter of 06/05/17 (from the past 24 hour(s))  CBC     Status: Abnormal   Collection Time: 06/06/17  5:35 AM  Result Value Ref Range   WBC 8.9 3.6 - 11.0 K/uL   RBC 4.28 3.80 - 5.20 MIL/uL   Hemoglobin 8.3 (L) 12.0 - 16.0 g/dL   HCT 26.3 (L) 35.0 - 47.0 %   MCV 61.4 (L) 80.0 - 100.0 fL   MCH 19.5 (L) 26.0 - 34.0 pg   MCHC 31.8 (L) 32.0 - 36.0 g/dL   RDW 19.8 (H) 11.5 - 14.5 %   Platelets 415 150 - 440 K/uL  Basic metabolic panel     Status: Abnormal   Collection Time: 06/06/17  5:35 AM  Result Value Ref Range   Sodium 139 135 - 145 mmol/L   Potassium 4.2 3.5 - 5.1 mmol/L   Chloride 109 101 - 111 mmol/L   CO2 26 22 - 32 mmol/L   Glucose, Bld 103 (H) 65 - 99 mg/dL   BUN 7 6 - 20 mg/dL   Creatinine, Ser 0.68 0.44 - 1.00 mg/dL   Calcium 8.6 (L) 8.9 - 10.3 mg/dL   GFR calc non Af Amer >60 >60 mL/min   GFR calc Af Amer >60 >60 mL/min   Anion gap 4 (L) 5 - 15    Assessment: s/p Procedure(s): HYSTERECTOMY ABDOMINAL WITH SALPINGECTOMY (Bilateral): stable  Plan: full liquid diet   D/c IV   LOS: 1 day    Gwen Her Shauntelle Jamerson 06/06/2017, 11:53 AM

## 2017-06-07 NOTE — Progress Notes (Signed)
2 Days Post-Op Procedure(s) (LRB): HYSTERECTOMY ABDOMINAL WITH SALPINGECTOMY (Bilateral) Pt with some abd gas pains . No flatus this am  Full liquid diet  Subjective: Patient reports as above Objective: I have reviewed patient's vital signs.  General: alert and cooperative Resp: clear to auscultation bilaterally Cardio: regular rate and rhythm, S1, S2 normal, no murmur, click, rub or gallop GI: soft, non-tender; bowel sounds normal; no masses,  no organomegaly incision c/d/I  Assessment: s/p Procedure(s): HYSTERECTOMY ABDOMINAL WITH SALPINGECTOMY (Bilateral): stable Awaiting return of bowel function  Plan: Advance diet if flatus  Pt may shower   LOS: 2 days    Gwen Her Donnamaria Shands 06/07/2017, 7:58 AM

## 2017-06-08 ENCOUNTER — Other Ambulatory Visit: Payer: Self-pay | Admitting: Obstetrics and Gynecology

## 2017-06-08 DIAGNOSIS — R928 Other abnormal and inconclusive findings on diagnostic imaging of breast: Secondary | ICD-10-CM

## 2017-06-08 DIAGNOSIS — N6002 Solitary cyst of left breast: Secondary | ICD-10-CM

## 2017-06-08 MED ORDER — ONDANSETRON HCL 4 MG PO TABS: 4.0000 mg | ORAL_TABLET | Freq: Four times a day (QID) | ORAL | 0 refills | Status: DC | PRN

## 2017-06-08 MED ORDER — IBUPROFEN 800 MG PO TABS: 800.0000 mg | ORAL_TABLET | Freq: Three times a day (TID) | ORAL | 0 refills | Status: DC | PRN

## 2017-06-08 MED ORDER — HYDROCODONE-ACETAMINOPHEN 5-325 MG PO TABS: 1.0000 | ORAL_TABLET | ORAL | 0 refills | Status: DC | PRN

## 2017-06-08 MED ORDER — SIMETHICONE 80 MG PO CHEW: 80.0000 mg | CHEWABLE_TABLET | Freq: Four times a day (QID) | ORAL | 0 refills | Status: DC | PRN

## 2017-06-08 MED ORDER — DOCUSATE SODIUM 100 MG PO CAPS: 100.0000 mg | ORAL_CAPSULE | Freq: Two times a day (BID) | ORAL | 0 refills | Status: DC | PRN

## 2017-06-08 MED ORDER — DOCUSATE SODIUM 100 MG PO CAPS: 100.0000 mg | ORAL_CAPSULE | Freq: Two times a day (BID) | ORAL | Status: DC | PRN

## 2017-06-08 NOTE — Progress Notes (Signed)
Discharge instructions reviewed with patient.  All questions answered.  Follow up appointment scheduled.  

## 2017-06-08 NOTE — Discharge Summary (Signed)
Physician Discharge Summary  Patient ID: [name] MRN: [id number] DOB/AGE: [birth year] 41 y.o.  Admit date: 06/05/2017 Discharge date: 06/08/2017  Admission Diagnoses :menorrhagia  Discharge Diagnoses: same  Active Problems:   Post-operative state   Discharged Condition: good  Hospital Course: pt underwent an uncomplicated TAH bilateral salpingectomy . Uncomplicated post op course  Tolerating regular food on d/c  Consults: None  Significant Diagnostic Studies: none Treatments: surgery: TAH , bilateral salpingectomy   Discharge Exam: Blood pressure (!) 111/46, pulse 66, temperature 97.8 F (36.6 C), temperature source Oral, resp. rate 14, height 5\' 4"  (1.626 m), weight 72.6 kg (160 lb), last menstrual period 05/31/2017, SpO2 100 %. General appearance: alert and cooperative Head: Normocephalic, without obvious abnormality, atraumatic Resp: clear to auscultation bilaterally Cardio: regular rate and rhythm, S1, S2 normal, no murmur, click, rub or gallop GI: soft, non-tender; bowel sounds normal; no masses,  no organomegaly Incision : C/D/I  Disposition: 01-Home or Self Care  Discharge Instructions    Call MD for:  difficulty breathing, headache or visual disturbances    Complete by:  As directed    Call MD for:  extreme fatigue    Complete by:  As directed    Call MD for:  hives    Complete by:  As directed    Call MD for:  persistant dizziness or light-headedness    Complete by:  As directed    Call MD for:  persistant nausea and vomiting    Complete by:  As directed    Call MD for:  redness, tenderness, or signs of infection (pain, swelling, redness, odor or green/yellow discharge around incision site)    Complete by:  As directed    Call MD for:  severe uncontrolled pain    Complete by:  As directed    Call MD for:  temperature >100.4    Complete by:  As directed    Diet - low sodium heart healthy    Complete by:  As directed    Increase activity slowly    Complete  by:  As directed      Allergies as of 06/08/2017   No Known Allergies     Medication List    STOP taking these medications   fluconazole 150 MG tablet Commonly known as:  DIFLUCAN     TAKE these medications   docusate sodium 100 MG capsule Commonly known as:  COLACE Take 1 capsule (100 mg total) by mouth 2 (two) times daily as needed for mild constipation.   HYDROcodone-acetaminophen 5-325 MG tablet Commonly known as:  NORCO Take 1 tablet by mouth every 4 (four) hours as needed for moderate pain.   ibuprofen 800 MG tablet Commonly known as:  ADVIL,MOTRIN Take 1 tablet (800 mg total) by mouth 3 (three) times daily as needed for moderate pain.   ondansetron 4 MG tablet Commonly known as:  ZOFRAN Take 1 tablet (4 mg total) by mouth every 6 (six) hours as needed for nausea.   simethicone 80 MG chewable tablet Commonly known as:  MYLICON Chew 1 tablet (80 mg total) by mouth 4 (four) times daily as needed for flatulence.        Signed: Gwen Her Verlee Pope 06/08/2017, 7:45 AM

## 2017-06-09 LAB — SURGICAL PATHOLOGY

## 2017-06-12 ENCOUNTER — Ambulatory Visit
Admission: RE | Admit: 2017-06-12 | Discharge: 2017-06-12 | Disposition: A | Discharge status: Home / Self Care | Payer: BC Managed Care – PPO | Source: Ambulatory Visit | Attending: Obstetrics and Gynecology | Admitting: Obstetrics and Gynecology

## 2017-06-12 DIAGNOSIS — N6002 Solitary cyst of left breast: Secondary | ICD-10-CM

## 2017-06-12 DIAGNOSIS — R928 Other abnormal and inconclusive findings on diagnostic imaging of breast: Secondary | ICD-10-CM

## 2018-01-21 ENCOUNTER — Encounter: Payer: Self-pay | Admitting: Family Medicine

## 2018-01-21 ENCOUNTER — Ambulatory Visit
Admission: RE | Admit: 2018-01-21 | Discharge: 2018-01-21 | Disposition: A | Discharge status: Home / Self Care | Payer: BC Managed Care – PPO | Source: Ambulatory Visit | Attending: Family Medicine | Admitting: Family Medicine

## 2018-01-21 ENCOUNTER — Ambulatory Visit: Payer: BC Managed Care – PPO | Admitting: Family Medicine

## 2018-01-21 VITALS — BP 122/82 | HR 80 | Ht 64.0 in | Wt 166.0 lb

## 2018-01-21 DIAGNOSIS — R0781 Pleurodynia: Secondary | ICD-10-CM

## 2018-01-21 DIAGNOSIS — D169 Benign neoplasm of bone and articular cartilage, unspecified: Secondary | ICD-10-CM | POA: Diagnosis not present

## 2018-01-21 DIAGNOSIS — R0789 Other chest pain: Secondary | ICD-10-CM | POA: Insufficient documentation

## 2018-01-21 NOTE — Progress Notes (Signed)
Name: [name]   MRN: [id number]    DOB: 06-04-1976   Date:01/21/2018       Progress Note  Subjective  Chief Complaint  Chief Complaint  Patient presents with  . Establish Care    never had a PCP  . Mass    on L) rib- "can move it"    Patient noted an enlarged area inferior aspect chondra attachment left anterior rib cage.   Chest Pain   This is a new (tenderness/ prominence of chest wall) problem. The current episode started yesterday. The onset quality is sudden. The problem occurs constantly. The problem has been unchanged. Pain location: anteriolateral chondral attachment. The pain is moderate. Quality: tenderness. The pain does not radiate. Pertinent negatives include no abdominal pain, back pain, claudication, cough, diaphoresis, dizziness, fever, headaches, hemoptysis, irregular heartbeat, leg pain, lower extremity edema, malaise/fatigue, nausea, orthopnea, palpitations, PND, shortness of breath, sputum production or syncope.    No problem-specific Assessment & Plan notes found for this encounter.   Past Medical History:  Diagnosis Date  . Anemia   . Breast cyst, left    benign  . Pre-diabetes    diet controlled  . Seizures ([place])    Age 27 to 42 years old    Past Surgical History:  Procedure Laterality Date  . CESAREAN SECTION     2003, and 2008  . HYSTERECTOMY ABDOMINAL WITH SALPINGECTOMY Bilateral 06/05/2017   Procedure: HYSTERECTOMY ABDOMINAL WITH SALPINGECTOMY;  Surgeon: Schermerhorn, Gwen Her, MD;  Location: [facility] ORS;  Service: Gynecology;  Laterality: Bilateral;  . TUBAL LIGATION      Family History  Problem Relation Age of Onset  . Breast cancer Maternal Aunt        past menop.  Marland Kitchen Hypertension Mother   . Diabetes Father   . Cancer Paternal Grandfather     Social History   Socioeconomic History  . Marital status: Married    Spouse name: Not on file  . Number of children: Not on file  . Years of education: Not on file  . Highest  education level: Not on file  Social Needs  . Financial resource strain: Not on file  . Food insecurity - worry: Not on file  . Food insecurity - inability: Not on file  . Transportation needs - medical: Not on file  . Transportation needs - non-medical: Not on file  Occupational History  . Not on file  Tobacco Use  . Smoking status: Never Smoker  . Smokeless tobacco: Never Used  Substance and Sexual Activity  . Alcohol use: No  . Drug use: No  . Sexual activity: Yes    Birth control/protection: Surgical  Other Topics Concern  . Not on file  Social History Narrative  . Not on file    No Known Allergies  Outpatient Medications Prior to Visit  Medication Sig Dispense Refill  . docusate sodium (COLACE) 100 MG capsule Take 1 capsule (100 mg total) by mouth 2 (two) times daily as needed for mild constipation. 10 capsule 0  . HYDROcodone-acetaminophen (NORCO) 5-325 MG tablet Take 1 tablet by mouth every 4 (four) hours as needed for moderate pain. 20 tablet 0  . ibuprofen (ADVIL,MOTRIN) 800 MG tablet Take 1 tablet (800 mg total) by mouth 3 (three) times daily as needed for moderate pain. 30 tablet 0  . ondansetron (ZOFRAN) 4 MG tablet Take 1 tablet (4 mg total) by mouth every 6 (six) hours as needed for nausea. 20 tablet 0  .  simethicone (MYLICON) 80 MG chewable tablet Chew 1 tablet (80 mg total) by mouth 4 (four) times daily as needed for flatulence. 30 tablet 0   No facility-administered medications prior to visit.     Review of Systems  Constitutional: Negative for chills, diaphoresis, fever, malaise/fatigue and weight loss.  HENT: Negative for ear discharge, ear pain and sore throat.   Eyes: Negative for blurred vision.  Respiratory: Negative for cough, hemoptysis, sputum production, shortness of breath and wheezing.   Cardiovascular: Positive for chest pain. Negative for palpitations, orthopnea, claudication, leg swelling, syncope and PND.  Gastrointestinal: Negative for  abdominal pain, blood in stool, constipation, diarrhea, heartburn, melena and nausea.  Genitourinary: Negative for dysuria, frequency, hematuria and urgency.  Musculoskeletal: Negative for back pain, joint pain, myalgias and neck pain.  Skin: Negative for rash.  Neurological: Negative for dizziness, tingling, sensory change, focal weakness and headaches.  Endo/Heme/Allergies: Negative for environmental allergies and polydipsia. Does not bruise/bleed easily.  Psychiatric/Behavioral: Negative for depression and suicidal ideas. The patient is not nervous/anxious and does not have insomnia.      Objective  Vitals:   01/21/18 1438  BP: 122/82  Pulse: 80  Weight: 166 lb (75.3 kg)  Height: 5\' 4"  (1.626 m)    Physical Exam  Constitutional: She is well-developed, well-nourished, and in no distress. No distress.  HENT:  Head: Normocephalic and atraumatic.  Right Ear: External ear normal.  Left Ear: External ear normal.  Nose: Nose normal.  Mouth/Throat: Oropharynx is clear and moist.  Eyes: Conjunctivae and EOM are normal. Pupils are equal, round, and reactive to light. Right eye exhibits no discharge. Left eye exhibits no discharge.  Neck: Normal range of motion. Neck supple. No JVD present. No thyromegaly present.  Cardiovascular: Normal rate, regular rhythm, normal heart sounds and intact distal pulses. Exam reveals no gallop and no friction rub.  No murmur heard. Pulmonary/Chest: Effort normal and breath sounds normal. No respiratory distress. She has no wheezes. She has no rales. She exhibits bony tenderness and deformity. She exhibits no tenderness.    Fullness anterior chest wall  Abdominal: Soft. Bowel sounds are normal. She exhibits no mass. There is no tenderness. There is no guarding.  Musculoskeletal: Normal range of motion. She exhibits no edema.  Lymphadenopathy:    She has no cervical adenopathy.  Neurological: She is alert. She has normal reflexes.  Skin: Skin is warm  and dry. She is not diaphoretic.  Psychiatric: Mood and affect normal.  Nursing note and vitals reviewed.     Assessment & Plan  Problem List Items Addressed This Visit    None    Visit Diagnoses    Costal margin pain    -  Primary   Relevant Orders   DG Ribs Unilateral Left   Chondroma of periosteum       Relevant Orders   DG Ribs Unilateral Left   Chondroma          No orders of the defined types were placed in this encounter.     Dr. [facility]  01/21/18

## 2018-02-12 ENCOUNTER — Encounter: Payer: Self-pay | Admitting: Family Medicine

## 2018-02-12 ENCOUNTER — Ambulatory Visit: Payer: BC Managed Care – PPO | Admitting: Family Medicine

## 2018-02-12 VITALS — BP 120/80 | HR 88 | Ht 64.0 in | Wt 164.0 lb

## 2018-02-12 DIAGNOSIS — E7800 Pure hypercholesterolemia, unspecified: Secondary | ICD-10-CM

## 2018-02-12 DIAGNOSIS — R739 Hyperglycemia, unspecified: Secondary | ICD-10-CM | POA: Diagnosis not present

## 2018-02-12 DIAGNOSIS — R109 Unspecified abdominal pain: Secondary | ICD-10-CM

## 2018-02-12 DIAGNOSIS — R5383 Other fatigue: Secondary | ICD-10-CM

## 2018-02-12 NOTE — Progress Notes (Signed)
Name: [name]   MRN: [id number]    DOB: 1975/12/31   Date:02/12/2018       Progress Note  Subjective  Chief Complaint  Chief Complaint  Patient presents with  . Advice Only    wants to discuss possibility of celiac disease. Has cut out pastas, breads, etc due to feeling bloated after eating these things and causing a change in bowel habits after eating these foods. Father has been Dx with celiac disease   . Hyperglycemia    wants A1C rechecked- above a 7 last time with endo    Hyperglycemia  This is a recurrent problem. The current episode started more than 1 year ago. The problem occurs intermittently. The problem has been waxing and waning. Associated symptoms include abdominal pain and a change in bowel habit. Pertinent negatives include no anorexia, chest pain, chills, coughing, fatigue, fever, headaches, myalgias, nausea, neck pain, rash, sore throat or vomiting. Associated symptoms comments: bloat. The treatment provided mild relief.  Abdominal Cramping  This is a recurrent (for possible celiac) problem. The current episode started more than 1 year ago (2 years). The onset quality is gradual. The problem occurs intermittently. The problem has been waxing and waning. The pain is located in the generalized abdominal region. The pain is at a severity of 6/10. The pain is moderate. The quality of the pain is cramping. The abdominal pain does not radiate. Associated symptoms include belching, constipation, diarrhea and flatus. Pertinent negatives include no anorexia, dysuria, fever, frequency, headaches, hematuria, melena, myalgias, nausea, vomiting or weight loss. Exacerbated by: certain foods. She has tried nothing for the symptoms. Improvement on treatment: gluten avoidance. There is no history of abdominal surgery, Crohn's disease, irritable bowel syndrome or ulcerative colitis.  Hyperlipidemia  This is a recurrent problem. The current episode started more than 1 year ago. The problem is  controlled. She has no history of chronic renal disease, diabetes, hypothyroidism or obesity. Pertinent negatives include no chest pain, focal weakness, myalgias or shortness of breath.    No problem-specific Assessment & Plan notes found for this encounter.   Past Medical History:  Diagnosis Date  . Anemia   . Breast cyst, left    benign  . Pre-diabetes    diet controlled  . Seizures ([place])    Age 4 to 43 years old    Past Surgical History:  Procedure Laterality Date  . CESAREAN SECTION     2003, and 2008  . HYSTERECTOMY ABDOMINAL WITH SALPINGECTOMY Bilateral 06/05/2017   Procedure: HYSTERECTOMY ABDOMINAL WITH SALPINGECTOMY;  Surgeon: Schermerhorn, Gwen Her, MD;  Location: [facility] ORS;  Service: Gynecology;  Laterality: Bilateral;  . TUBAL LIGATION      Family History  Problem Relation Age of Onset  . Breast cancer Maternal Aunt        past menop.  Marland Kitchen Hypertension Mother   . Diabetes Father   . Cancer Paternal Grandfather     Social History   Socioeconomic History  . Marital status: Married    Spouse name: Not on file  . Number of children: Not on file  . Years of education: Not on file  . Highest education level: Not on file  Social Needs  . Financial resource strain: Not on file  . Food insecurity - worry: Not on file  . Food insecurity - inability: Not on file  . Transportation needs - medical: Not on file  . Transportation needs - non-medical: Not on file  Occupational History  . Not  on file  Tobacco Use  . Smoking status: Never Smoker  . Smokeless tobacco: Never Used  Substance and Sexual Activity  . Alcohol use: No  . Drug use: No  . Sexual activity: Yes    Birth control/protection: Surgical  Other Topics Concern  . Not on file  Social History Narrative  . Not on file    No Known Allergies  No outpatient medications prior to visit.   No facility-administered medications prior to visit.     Review of Systems  Constitutional: Negative  for chills, fatigue, fever, malaise/fatigue and weight loss.  HENT: Negative for ear discharge, ear pain and sore throat.   Eyes: Negative for blurred vision.  Respiratory: Negative for cough, sputum production, shortness of breath and wheezing.   Cardiovascular: Negative for chest pain, palpitations and leg swelling.  Gastrointestinal: Positive for abdominal pain, change in bowel habit, constipation, diarrhea and flatus. Negative for anorexia, blood in stool, heartburn, melena, nausea and vomiting.  Genitourinary: Negative for dysuria, frequency, hematuria and urgency.  Musculoskeletal: Negative for back pain, joint pain, myalgias and neck pain.  Skin: Negative for rash.  Neurological: Negative for dizziness, tingling, sensory change, focal weakness and headaches.  Endo/Heme/Allergies: Negative for environmental allergies and polydipsia. Does not bruise/bleed easily.  Psychiatric/Behavioral: Negative for depression and suicidal ideas. The patient is not nervous/anxious and does not have insomnia.      Objective  Vitals:   02/12/18 0805  BP: 120/80  Pulse: 88  Weight: 164 lb (74.4 kg)  Height: 5\' 4"  (1.626 m)    Physical Exam  Constitutional: She is well-developed, well-nourished, and in no distress. No distress.  HENT:  Head: Normocephalic and atraumatic.  Right Ear: External ear normal.  Left Ear: External ear normal.  Nose: Nose normal.  Mouth/Throat: Oropharynx is clear and moist.  Eyes: Conjunctivae and EOM are normal. Pupils are equal, round, and reactive to light. Right eye exhibits no discharge. Left eye exhibits no discharge.  Neck: Normal range of motion. Neck supple. No JVD present. No thyromegaly present.  Cardiovascular: Normal rate, regular rhythm, normal heart sounds and intact distal pulses. Exam reveals no gallop and no friction rub.  No murmur heard. Pulmonary/Chest: Effort normal and breath sounds normal. She has no wheezes. She has no rales.  Abdominal: Soft.  Bowel sounds are normal. She exhibits no mass. There is no tenderness. There is no guarding.  Musculoskeletal: Normal range of motion. She exhibits no edema.  Lymphadenopathy:    She has no cervical adenopathy.  Neurological: She is alert. She has normal reflexes.  Skin: Skin is warm and dry. She is not diaphoretic.  Psychiatric: Mood and affect normal.  Nursing note and vitals reviewed.     Assessment & Plan  Problem List Items Addressed This Visit    None    Visit Diagnoses    Abdominal cramping    -  Primary   Relevant Orders   Celiac Disease Ab Screen w/Rfx   Fatigue, unspecified type       Relevant Orders   TSH   CBC with Differential/Platelet   Renal Function Panel   Pure hypercholesterolemia       Relevant Orders   Lipid panel   Hyperglycemia       Relevant Orders   Hemoglobin A1c      No orders of the defined types were placed in this encounter.     Dr. [facility]  02/12/18

## 2018-02-16 ENCOUNTER — Encounter: Payer: Self-pay | Admitting: Family Medicine

## 2018-02-18 ENCOUNTER — Encounter: Payer: Self-pay | Admitting: Family Medicine

## 2018-02-18 ENCOUNTER — Ambulatory Visit: Payer: BC Managed Care – PPO | Admitting: Family Medicine

## 2018-02-18 VITALS — BP 130/80 | HR 64 | Ht 64.0 in | Wt 164.0 lb

## 2018-02-18 DIAGNOSIS — K9041 Non-celiac gluten sensitivity: Secondary | ICD-10-CM

## 2018-02-18 DIAGNOSIS — D509 Iron deficiency anemia, unspecified: Secondary | ICD-10-CM

## 2018-02-18 LAB — CBC WITH DIFFERENTIAL/PLATELET
BASOS ABS: 0 10*3/uL (ref 0.0–0.2)
Basos: 1 %
EOS (ABSOLUTE): 0.1 10*3/uL (ref 0.0–0.4)
EOS: 2 %
HEMATOCRIT: 31.7 % — AB (ref 34.0–46.6)
HEMOGLOBIN: 8.9 g/dL — AB (ref 11.1–15.9)
IMMATURE GRANULOCYTES: 0 %
Immature Grans (Abs): 0 10*3/uL (ref 0.0–0.1)
LYMPHS ABS: 1.8 10*3/uL (ref 0.7–3.1)
Lymphs: 40 %
MCH: 19.1 pg — ABNORMAL LOW (ref 26.6–33.0)
MCHC: 28.1 g/dL — AB (ref 31.5–35.7)
MCV: 68 fL — ABNORMAL LOW (ref 79–97)
MONOCYTES: 9 %
Monocytes Absolute: 0.4 10*3/uL (ref 0.1–0.9)
NEUTROS PCT: 48 %
Neutrophils Absolute: 2.1 10*3/uL (ref 1.4–7.0)
Platelets: 479 10*3/uL — ABNORMAL HIGH (ref 150–379)
RBC: 4.67 x10E6/uL (ref 3.77–5.28)
RDW: 19.9 % — AB (ref 12.3–15.4)
WBC: 4.4 10*3/uL (ref 3.4–10.8)

## 2018-02-18 LAB — RENAL FUNCTION PANEL
ALBUMIN: 4 g/dL (ref 3.5–5.5)
BUN/Creatinine Ratio: 7 — ABNORMAL LOW (ref 9–23)
BUN: 5 mg/dL — AB (ref 6–24)
CHLORIDE: 106 mmol/L (ref 96–106)
CO2: 21 mmol/L (ref 20–29)
Calcium: 8.6 mg/dL — ABNORMAL LOW (ref 8.7–10.2)
Creatinine, Ser: 0.74 mg/dL (ref 0.57–1.00)
GFR calc Af Amer: 117 mL/min/{1.73_m2} (ref >59)
GFR calc non Af Amer: 102 mL/min/{1.73_m2} (ref >59)
GLUCOSE: 72 mg/dL (ref 65–99)
POTASSIUM: 4.7 mmol/L (ref 3.5–5.2)
Phosphorus: 3.4 mg/dL (ref 2.5–4.5)
Sodium: 141 mmol/L (ref 134–144)

## 2018-02-18 LAB — LIPID PANEL
CHOL/HDL RATIO: 3.4 ratio (ref 0.0–4.4)
CHOLESTEROL TOTAL: 100 mg/dL (ref 100–199)
HDL: 29 mg/dL — AB (ref >39)
LDL CALC: 61 mg/dL (ref 0–99)
TRIGLYCERIDES: 51 mg/dL (ref 0–149)
VLDL Cholesterol Cal: 10 mg/dL (ref 5–40)

## 2018-02-18 LAB — CELIAC DISEASE AB SCREEN W/RFX
ANTIGLIADIN ABS, IGA: 80 U — AB (ref 0–19)
IgA/Immunoglobulin A, Serum: 320 mg/dL (ref 87–352)
Transglutaminase IgA: 86 U/mL — ABNORMAL HIGH (ref 0–3)

## 2018-02-18 LAB — TSH: TSH: 1.85 u[IU]/mL (ref 0.450–4.500)

## 2018-02-18 LAB — HEMOGLOBIN A1C
Est. average glucose Bld gHb Est-mCnc: 108 mg/dL
Hgb A1c MFr Bld: 5.4 % (ref 4.8–5.6)

## 2018-02-18 LAB — ENDOMYSIAL IGA ANTIBODY: ENDOMYSIAL IGA: POSITIVE — AB

## 2018-02-18 NOTE — Progress Notes (Signed)
Name: [name]   MRN: [id number]    DOB: 1976/06/13   Date:02/18/2018       Progress Note  Subjective  Chief Complaint  Chief Complaint  Patient presents with  . Consult    lab work results    Diarrhea   This is a new problem. The current episode started more than 1 year ago. The problem has been waxing and waning. The stool consistency is described as mucous. The patient states that diarrhea does not awaken her from sleep. Associated symptoms include abdominal pain, bloating, increased flatus and sweats. Pertinent negatives include no arthralgias, chills, coughing, fever, headaches, myalgias, URI, vomiting or weight loss. The symptoms are aggravated by rye/wheat. Risk factors include travel to endemic area. She has tried anti-motility drug for the symptoms. The treatment provided moderate relief. There is no history of inflammatory bowel disease, irritable bowel syndrome, a recent abdominal surgery or short gut syndrome.    No problem-specific Assessment & Plan notes found for this encounter.   Past Medical History:  Diagnosis Date  . Anemia   . Breast cyst, left    benign  . Pre-diabetes    diet controlled  . Seizures ([place])    Age 7 to 42 years old    Past Surgical History:  Procedure Laterality Date  . CESAREAN SECTION     2003, and 2008  . HYSTERECTOMY ABDOMINAL WITH SALPINGECTOMY Bilateral 06/05/2017   Procedure: HYSTERECTOMY ABDOMINAL WITH SALPINGECTOMY;  Surgeon: Schermerhorn, Gwen Her, MD;  Location: [facility] ORS;  Service: Gynecology;  Laterality: Bilateral;  . TUBAL LIGATION      Family History  Problem Relation Age of Onset  . Breast cancer Maternal Aunt        past menop.  Marland Kitchen Hypertension Mother   . Diabetes Father   . Cancer Paternal Grandfather     Social History   Socioeconomic History  . Marital status: Married    Spouse name: Not on file  . Number of children: Not on file  . Years of education: Not on file  . Highest education level: Not on  file  Social Needs  . Financial resource strain: Not on file  . Food insecurity - worry: Not on file  . Food insecurity - inability: Not on file  . Transportation needs - medical: Not on file  . Transportation needs - non-medical: Not on file  Occupational History  . Not on file  Tobacco Use  . Smoking status: Never Smoker  . Smokeless tobacco: Never Used  Substance and Sexual Activity  . Alcohol use: No  . Drug use: No  . Sexual activity: Yes    Birth control/protection: Surgical  Other Topics Concern  . Not on file  Social History Narrative  . Not on file    No Known Allergies  No outpatient medications prior to visit.   No facility-administered medications prior to visit.     Review of Systems  Constitutional: Negative for chills, fever, malaise/fatigue and weight loss.  HENT: Negative for ear discharge, ear pain and sore throat.   Eyes: Negative for blurred vision.  Respiratory: Negative for cough, sputum production, shortness of breath and wheezing.   Cardiovascular: Negative for chest pain, palpitations and leg swelling.  Gastrointestinal: Positive for abdominal pain, bloating, diarrhea and flatus. Negative for blood in stool, constipation, heartburn, melena, nausea and vomiting.  Genitourinary: Negative for dysuria, frequency, hematuria and urgency.  Musculoskeletal: Negative for arthralgias, back pain, joint pain, myalgias and neck pain.  Skin:  Negative for rash.  Neurological: Negative for dizziness, tingling, sensory change, focal weakness and headaches.  Endo/Heme/Allergies: Negative for environmental allergies and polydipsia. Does not bruise/bleed easily.  Psychiatric/Behavioral: Negative for depression and suicidal ideas. The patient is not nervous/anxious and does not have insomnia.      Objective  Vitals:   02/18/18 1527  BP: 130/80  Pulse: 64  Weight: 164 lb (74.4 kg)  Height: 5\' 4"  (1.626 m)    Physical Exam  Constitutional: She is  well-developed, well-nourished, and in no distress. No distress.  HENT:  Head: Normocephalic and atraumatic.  Right Ear: External ear normal.  Left Ear: External ear normal.  Nose: Nose normal.  Mouth/Throat: Oropharynx is clear and moist.  Eyes: Conjunctivae and EOM are normal. Pupils are equal, round, and reactive to light. Right eye exhibits no discharge. Left eye exhibits no discharge.  Neck: Normal range of motion. Neck supple. No JVD present. No thyromegaly present.  Cardiovascular: Normal rate, regular rhythm, normal heart sounds and intact distal pulses. Exam reveals no gallop and no friction rub.  No murmur heard. Pulmonary/Chest: Effort normal and breath sounds normal. She has no wheezes. She has no rales.  Abdominal: Soft. Bowel sounds are normal. She exhibits no mass. There is no tenderness. There is no guarding.  Musculoskeletal: Normal range of motion. She exhibits no edema.  Lymphadenopathy:    She has no cervical adenopathy.  Neurological: She is alert. She has normal reflexes.  Skin: Skin is warm and dry. She is not diaphoretic. No erythema.  Psychiatric: Mood and affect normal.  Nursing note and vitals reviewed.     Assessment & Plan  Problem List Items Addressed This Visit    None    Visit Diagnoses    Gluten enteropathy    -  Primary   Relevant Orders   Ferritin   Hepatic Function Panel (6)   Iron deficiency anemia, unspecified iron deficiency anemia type       Relevant Orders   Ferritin      No orders of the defined types were placed in this encounter.     Dr. [facility]  02/18/18

## 2018-02-18 NOTE — Patient Instructions (Signed)
Gluten-Free Diet for Celiac Disease, Adult The gluten-free diet includes all foods that do not contain gluten. Gluten is a protein that is found in wheat, rye, barley, and some other grains. Following the gluten-free diet is the only treatment for people with celiac disease. It helps to prevent damage to the intestines and improves or eliminates the symptoms of celiac disease. Following the gluten-free diet requires some planning. It can be challenging at first, but it gets easier with time and practice. There are more gluten-free options available today than ever before. If you need help finding gluten-free foods or if you have questions, talk with your diet and nutrition specialist (registered dietitian) or your health care provider. What do I need to know about a gluten-free diet?  All fruits, vegetables, and meats are safe to eat and do not contain gluten.  When grocery shopping, start by shopping in the produce, meat, and dairy sections. These sections are more likely to contain gluten-free foods. Then move to the aisles that contain packaged foods if you need to.  Read all food labels. Gluten is often added to foods. Always check the ingredient list and look for warnings, such as "may contain gluten."  Talk with your dietitian or health care provider before taking a gluten-free multivitamin or mineral supplement.  Be aware of gluten-free foods having contact with foods that contain gluten (cross-contamination). This can happen at home and with any processed foods. ? Talk with your health care provider or dietitian about how to reduce the risk of cross-contamination in your home. ? If you have questions about how a food is processed, ask the manufacturer. What key words help to identify gluten? Foods that list any of these key words on the label usually contain gluten:  Wheat, flour, enriched flour, bromated flour, white flour, durum flour, graham flour, phosphated flour, self-rising flour,  semolina, farina, barley (malt), rye, and oats.  Starch, dextrin, modified food starch, or cereal.  Thickening, fillers, or emulsifiers.  Malt flavoring, malt extract, or malt syrup.  Hydrolyzed vegetable protein.  In the [place] packaged foods that are gluten-free are required to be labeled "GF." These foods should be easy to identify and are safe to eat. In the [place] food companies are also required to list common food allergens, including wheat, on their labels. Recommended foods Grains  Amaranth, bean flours, 100% buckwheat flour, corn, millet, nut flours or nut meals, GF oats, quinoa, rice, sorghum, teff, rice wafers, pure cornmeal tortillas, popcorn, and hot cereals made from cornmeal. Hominy, rice, wild rice. Some Asian rice noodles or bean noodles. Arrowroot starch, corn bran, corn flour, corn germ, cornmeal, corn starch, potato flour, potato starch flour, and rice bran. Plain, brown, and sweet rice flours. Rice polish, soy flour, and tapioca starch. Vegetables  All plain fresh, frozen, and canned vegetables. Fruits  All plain fresh, frozen, canned, and dried fruits, and 100% fruit juices. Meats and other protein foods  All fresh beef, pork, poultry, fish, seafood, and eggs. Fish canned in water, oil, brine, or vegetable broth. Plain nuts and seeds, peanut butter. Some lunch meat and some frankfurters. Dried beans, dried peas, and lentils. Dairy  Fresh plain, dry, evaporated, or condensed milk. Cream, butter, sour cream, whipping cream, and most yogurts. Unprocessed cheese, most processed cheeses, some cottage cheese, some cream cheeses. Beverages  Coffee, tea, most herbal teas. Carbonated beverages and some root beers. Wine, sake, and distilled spirits, such as gin, vodka, and whiskey. Most hard ciders. Fats and oils  Butter,   margarine, vegetable oil, hydrogenated butter, olive oil, shortening, lard, cream, and some mayonnaise. Some commercial salad dressings. Olives. Sweets  and desserts  Sugar, honey, some syrups, molasses, jelly, and jam. Plain hard candy, marshmallows, and gumdrops. Pure cocoa powder. Plain chocolate. Custard and some pudding mixes. Gelatin desserts, sorbets, frozen ice pops, and sherbet. Cake, cookies, and other desserts prepared with allowed flours. Some commercial ice creams. Cornstarch, tapioca, and rice puddings. Seasoning and other foods  Some canned or frozen soups. Monosodium glutamate (MSG). Cider, rice, and wine vinegar. Baking soda and baking powder. Cream of tartar. Baking and nutritional yeast. Certain soy sauces made without wheat (ask your dietitian about specific brands that are allowed). Nuts, coconut, and chocolate. Salt, pepper, herbs, spices, flavoring extracts, imitation or artificial flavorings, natural flavorings, and food colorings. Some medicines and supplements. Some lip glosses and other cosmetics. Rice syrups. The items listed may not be a complete list. Talk with your dietitian about what dietary choices are best for you. Foods to avoid Grains  Barley, bran, bulgur, couscous, cracked wheat, [place], farro, graham, malt, matzo, semolina, wheat germ, and all wheat and rye cereals including spelt and kamut. Cereals containing malt as a flavoring, such as rice cereal. Noodles, spaghetti, macaroni, most packaged rice mixes, and all mixes containing wheat, rye, barley, or triticale. Vegetables  Most creamed vegetables and most vegetables canned in sauces. Some commercially prepared vegetables and salads. Fruits  Thickened or prepared fruits and some pie fillings. Some fruit snacks and fruit roll-ups. Meats and other protein foods  Any meat or meat alternative containing wheat, rye, barley, or gluten stabilizers. These are often marinated or packaged meats and lunch meats. Bread-containing products, such as Swiss steak, croquettes, meatballs, and meatloaf. Most tuna canned in vegetable broth and turkey with hydrolyzed vegetable  protein (HVP) injected as part of the basting. Seitan. Imitation fish. Eggs in sauces made from ingredients to avoid. Dairy  Commercial chocolate milk drinks and malted milk. Some non-dairy creamers. Any cheese product containing ingredients to avoid. Beverages  Certain cereal beverages. Beer, ale, malted milk, and some root beers. Some hard ciders. Some instant flavored coffees. Some herbal teas made with barley or with barley malt added. Fats and oils  Some commercial salad dressings. Sour cream containing modified food starch. Sweets and desserts  Some toffees. Chocolate-coated nuts (may be rolled in wheat flour) and some commercial candies and candy bars. Most cakes, cookies, donuts, pastries, and other baked goods. Some commercial ice cream. Ice cream cones. Commercially prepared mixes for cakes, cookies, and other desserts. Bread pudding and other puddings thickened with flour. Products containing brown rice syrup made with barley malt enzyme. Desserts and sweets made with malt flavoring. Seasoning and other foods  Some curry powders, some dry seasoning mixes, some gravy extracts, some meat sauces, some ketchups, some prepared mustards, and horseradish. Certain soy sauces. Malt vinegar. Bouillon and bouillon cubes that contain HVP. Some chip dips, and some chewing gum. Yeast extract. Brewer's yeast. Caramel color. Some medicines and supplements. Some lip glosses and other cosmetics. The items listed may not be a complete list. Talk with your dietitian about what dietary choices are best for you. Summary  Gluten is a protein that is found in wheat, rye, barley, and some other grains. The gluten-free diet includes all foods that do not contain gluten.  If you need help finding gluten-free foods or if you have questions, talk with your diet and nutrition specialist (registered dietitian) or your health care provider.  Read   all food labels. Gluten is often added to foods. Always check the  ingredient list and look for warnings, such as "may contain gluten." This information is not intended to replace advice given to you by your health care provider. Make sure you discuss any questions you have with your health care provider. Document Released: 12/08/2005 Document Revised: 09/22/2016 Document Reviewed: 09/22/2016 Elsevier Interactive Patient Education  2018 [employer] Inc.  

## 2018-02-19 LAB — FERRITIN: Ferritin: 4 ng/mL — ABNORMAL LOW (ref 15–150)

## 2018-02-19 LAB — HEPATIC FUNCTION PANEL (6)
ALK PHOS: 125 IU/L — AB (ref 39–117)
ALT: 27 IU/L (ref 0–32)
AST: 39 IU/L (ref 0–40)
Albumin: 4.3 g/dL (ref 3.5–5.5)
BILIRUBIN, DIRECT: 0.11 mg/dL (ref 0.00–0.40)
Bilirubin Total: 0.3 mg/dL (ref 0.0–1.2)

## 2018-07-14 ENCOUNTER — Other Ambulatory Visit: Payer: Self-pay | Admitting: Obstetrics and Gynecology

## 2018-07-14 DIAGNOSIS — Z1231 Encounter for screening mammogram for malignant neoplasm of breast: Secondary | ICD-10-CM

## 2018-07-28 ENCOUNTER — Ambulatory Visit
Admission: RE | Admit: 2018-07-28 | Discharge: 2018-07-28 | Disposition: A | Discharge status: Home / Self Care | Payer: BC Managed Care – PPO | Source: Ambulatory Visit | Attending: Obstetrics and Gynecology | Admitting: Obstetrics and Gynecology

## 2018-07-28 DIAGNOSIS — Z1231 Encounter for screening mammogram for malignant neoplasm of breast: Secondary | ICD-10-CM | POA: Diagnosis present

## 2019-08-01 ENCOUNTER — Other Ambulatory Visit: Payer: Self-pay | Admitting: Obstetrics and Gynecology

## 2019-08-01 DIAGNOSIS — Z1231 Encounter for screening mammogram for malignant neoplasm of breast: Secondary | ICD-10-CM

## 2019-08-16 ENCOUNTER — Ambulatory Visit: Payer: BC Managed Care – PPO

## 2019-09-07 ENCOUNTER — Other Ambulatory Visit: Payer: Self-pay

## 2019-09-07 ENCOUNTER — Ambulatory Visit
Admission: RE | Admit: 2019-09-07 | Discharge: 2019-09-07 | Disposition: A | Discharge status: Home / Self Care | Payer: BC Managed Care – PPO | Source: Ambulatory Visit | Attending: Obstetrics and Gynecology | Admitting: Obstetrics and Gynecology

## 2019-09-07 DIAGNOSIS — Z1231 Encounter for screening mammogram for malignant neoplasm of breast: Secondary | ICD-10-CM | POA: Insufficient documentation

## 2019-12-01 ENCOUNTER — Other Ambulatory Visit: Payer: Self-pay

## 2019-12-01 DIAGNOSIS — Z20822 Contact with and (suspected) exposure to covid-19: Secondary | ICD-10-CM

## 2019-12-02 LAB — NOVEL CORONAVIRUS, NAA: SARS-CoV-2, NAA: NOT DETECTED

## 2020-08-10 ENCOUNTER — Telehealth: Payer: Self-pay

## 2020-08-10 ENCOUNTER — Telehealth (INDEPENDENT_AMBULATORY_CARE_PROVIDER_SITE_OTHER): Payer: 59 | Admitting: Family Medicine

## 2020-08-10 ENCOUNTER — Encounter: Payer: Self-pay | Admitting: Family Medicine

## 2020-08-10 VITALS — Temp 97.8°F | Ht 63.5 in | Wt 138.0 lb

## 2020-08-10 DIAGNOSIS — R7303 Prediabetes: Secondary | ICD-10-CM | POA: Insufficient documentation

## 2020-08-10 DIAGNOSIS — N946 Dysmenorrhea, unspecified: Secondary | ICD-10-CM | POA: Insufficient documentation

## 2020-08-10 DIAGNOSIS — Z7189 Other specified counseling: Secondary | ICD-10-CM

## 2020-08-10 DIAGNOSIS — U071 COVID-19: Secondary | ICD-10-CM | POA: Diagnosis not present

## 2020-08-10 DIAGNOSIS — R058 Other specified cough: Secondary | ICD-10-CM

## 2020-08-10 DIAGNOSIS — R05 Cough: Secondary | ICD-10-CM | POA: Diagnosis not present

## 2020-08-10 DIAGNOSIS — N83202 Unspecified ovarian cyst, left side: Secondary | ICD-10-CM | POA: Insufficient documentation

## 2020-08-10 DIAGNOSIS — N920 Excessive and frequent menstruation with regular cycle: Secondary | ICD-10-CM | POA: Insufficient documentation

## 2020-08-10 DIAGNOSIS — R87619 Unspecified abnormal cytological findings in specimens from cervix uteri: Secondary | ICD-10-CM | POA: Insufficient documentation

## 2020-08-10 MED ORDER — GUAIFENESIN-CODEINE 100-10 MG/5ML PO SYRP: 5.0000 mL | ORAL_SOLUTION | Freq: Four times a day (QID) | ORAL | 0 refills | Status: DC | PRN

## 2020-08-10 NOTE — Telephone Encounter (Signed)
This visit type is being conducted due to national recommendations for restrictions regarding the COVID- 19 Pandemic (e.g. social distancing) in effort to limit this patients exposure and mitigate transmission in our community. This visit type is felt to be most appropriate for this patient at this time.   I connected with the patient today and received telephone consent from the patient and patient understand this consent will be good for 1 year.  CM 

## 2020-08-10 NOTE — Progress Notes (Addendum)
Date:  08/10/2020   Name:  [name]   DOB:  1976-06-30   MRN:  [id number]   Chief Complaint: Cough (Sxs started yesterday. Patient bought 2 at home tests from [facility] last night and both came back POS for Covid. She has symptoms that started yesterday of low grade fever, hot flashes, cough with no mucous, congestion and headache. Both ears are sore in the inside. She can still test and smell, and has no fever currently. She has lost weight since her last visit with Dr Ronnald Ramp and does not qualify for infusion for covid. )  Cough This is a new problem. The current episode started yesterday. The problem has been unchanged. The problem occurs every few minutes. The cough is non-productive. Associated symptoms include ear congestion, ear pain, a fever, headaches, nasal congestion and rhinorrhea. Pertinent negatives include no chest pain, chills, hemoptysis, myalgias, postnasal drip, sore throat, shortness of breath, sweats or wheezing. Associated symptoms comments: 100.2. Risk factors: no exposure. The treatment provided moderate relief. There is no history of asthma, bronchiectasis, bronchitis, COPD, emphysema, environmental allergies or pneumonia.    Lab Results  Component Value Date   CREATININE 0.74 02/12/2018   BUN 5 (L) 02/12/2018   NA 141 02/12/2018   K 4.7 02/12/2018   CL 106 02/12/2018   CO2 21 02/12/2018   Lab Results  Component Value Date   CHOL 100 02/12/2018   HDL 29 (L) 02/12/2018   LDLCALC 61 02/12/2018   TRIG 51 02/12/2018   CHOLHDL 3.4 02/12/2018   Lab Results  Component Value Date   TSH 1.850 02/12/2018   Lab Results  Component Value Date   HGBA1C 5.4 02/12/2018   Lab Results  Component Value Date   WBC 4.4 02/12/2018   HGB 8.9 (L) 02/12/2018   HCT 31.7 (L) 02/12/2018   MCV 68 (L) 02/12/2018   PLT 479 (H) 02/12/2018   Lab Results  Component Value Date   ALT 27 02/18/2018   AST 39 02/18/2018   ALKPHOS 125 (H) 02/18/2018   BILITOT 0.3 02/18/2018      Review of Systems  Constitutional: Positive for fever. Negative for chills.  HENT: Positive for ear pain and rhinorrhea. Negative for postnasal drip and sore throat.   Respiratory: Positive for cough. Negative for hemoptysis, shortness of breath and wheezing.   Cardiovascular: Negative for chest pain.  Musculoskeletal: Negative for myalgias.  Allergic/Immunologic: Negative for environmental allergies.  Neurological: Positive for headaches.    Patient Active Problem List   Diagnosis Date Noted  . Abnormal Pap smear of cervix 08/10/2020  . Dysmenorrhea 08/10/2020  . Left ovarian cyst 08/10/2020  . Menorrhagia with regular cycle 08/10/2020  . Prediabetes 08/10/2020  . Anterior chest wall pain 01/21/2018  . Post-operative state 06/05/2017  . Irregular menstrual cycle 03/08/2014  . Other and unspecified ovarian cyst 03/08/2014    No Known Allergies  Past Surgical History:  Procedure Laterality Date  . BREAST CYST ASPIRATION Left 2018  . CESAREAN SECTION     2003, and 2008  . HYSTERECTOMY ABDOMINAL WITH SALPINGECTOMY Bilateral 06/05/2017   Procedure: HYSTERECTOMY ABDOMINAL WITH SALPINGECTOMY;  Surgeon: Schermerhorn, Gwen Her, MD;  Location: [facility] ORS;  Service: Gynecology;  Laterality: Bilateral;  . TUBAL LIGATION      Social History   Tobacco Use  . Smoking status: Never Smoker  . Smokeless tobacco: Never Used  Vaping Use  . Vaping Use: Never used  Substance Use Topics  . Alcohol use: No  .  Drug use: No     Medication list has been reviewed and updated.  Current Meds  Medication Sig  . phentermine (ADIPEX-P) 37.5 MG tablet Take 37.5 mg by mouth every morning. OBGYN- [place]    PHQ 2/9 Scores 08/10/2020 01/21/2018  PHQ - 2 Score 0 0  PHQ- 9 Score 0 1    GAD 7 : Generalized Anxiety Score 08/10/2020  Nervous, Anxious, on Edge 0  Control/stop worrying 0  Worry too much - different things 0  Trouble relaxing 0  Restless 0  Easily annoyed or irritable 0  Afraid -  awful might happen 0  Total GAD 7 Score 0  Anxiety Difficulty Not difficult at all    BP Readings from Last 3 Encounters:  02/18/18 130/80  02/12/18 120/80  01/21/18 122/82    Physical Exam  Wt Readings from Last 3 Encounters:  08/10/20 138 lb (62.6 kg)  02/18/18 164 lb (74.4 kg)  02/12/18 164 lb (74.4 kg)    Temp 97.8 F (36.6 C) (Oral)   Ht 5' 3.5" (1.613 m)   Wt 138 lb (62.6 kg)   LMP 05/31/2017 (Exact Date) Comment: scheduled hysterectomy on 06/05/2017  BMI 24.06 kg/m   Assessment and Plan:  1. Nonproductive cough Onset nonproductive cough yesterday with no known exposure to Covid.  As noted below there is a significant concern for Covid.  In the meantime patient has been instructed to stay at home until further confirmation and Robitussin-AC has been called in for cough relief. - guaiFENesin-codeine (ROBITUSSIN AC) 100-10 MG/5ML syrup; Take 5 mLs by mouth 4 (four) times daily as needed for cough.  Dispense: 118 mL; Refill: 0  2. COVID-19 determined by clinical diagnostic criteria Acute.  Persistent.  Episodic.  Patient took 2 over-the-counter test which were both positive for Covid and with symptoms and patient is not vaccinated.  Patient also is having other symptoms that are of upper respiratory nature and a fever last night of 100.3.  Because of the criteria of diagnostics and the 2 over-the-counter test which are considered may be equivocal patient has been routed to have a swab directed test at [facility] or [facility].  Patient is also been given information about urgent care if symptoms should get worse or if she needs further testing. - guaiFENesin-codeine (ROBITUSSIN AC) 100-10 MG/5ML syrup; Take 5 mLs by mouth 4 (four) times daily as needed for cough.  Dispense: 118 mL; Refill: 0  3. Advice given about COVID-19 virus infection Patient has been given the numbers for both [facility] to make an appointment for a Covid testing [phone] as well as the  number for the Covid information Line at the [place] [phone].  I spent 10 minutes with this patient, More than 50% of that time was spent in face to face education, counseling and care coordination.

## 2020-08-17 ENCOUNTER — Telehealth: Payer: Self-pay | Admitting: Family Medicine

## 2020-08-17 NOTE — Telephone Encounter (Signed)
Spoke to pt concerning mucinex DM, flonase nasal spray and push fluids and take tylenol for body aches.

## 2020-08-17 NOTE — Telephone Encounter (Signed)
Copied from [place] [id number]. Topic: General - Inquiry >> Aug 17, 2020 11:13 AM Alease Frame wrote: Reason for CRM: Pt called in stating she tested positive for covid and is requesting medications for chest congestion . Please advise

## 2020-08-30 ENCOUNTER — Other Ambulatory Visit: Payer: Self-pay | Admitting: Obstetrics and Gynecology

## 2020-08-30 DIAGNOSIS — Z1231 Encounter for screening mammogram for malignant neoplasm of breast: Secondary | ICD-10-CM

## 2020-09-17 ENCOUNTER — Ambulatory Visit
Admission: RE | Admit: 2020-09-17 | Discharge: 2020-09-17 | Disposition: A | Discharge status: Home / Self Care | Payer: 59 | Source: Ambulatory Visit | Attending: Obstetrics and Gynecology | Admitting: Obstetrics and Gynecology

## 2020-09-17 ENCOUNTER — Other Ambulatory Visit: Payer: Self-pay

## 2020-09-17 DIAGNOSIS — Z1231 Encounter for screening mammogram for malignant neoplasm of breast: Secondary | ICD-10-CM | POA: Insufficient documentation

## 2020-09-24 ENCOUNTER — Other Ambulatory Visit: Payer: Self-pay | Admitting: Obstetrics and Gynecology

## 2020-09-24 DIAGNOSIS — N6310 Unspecified lump in the right breast, unspecified quadrant: Secondary | ICD-10-CM

## 2020-09-24 DIAGNOSIS — R928 Other abnormal and inconclusive findings on diagnostic imaging of breast: Secondary | ICD-10-CM

## 2020-09-27 ENCOUNTER — Ambulatory Visit
Admission: RE | Admit: 2020-09-27 | Discharge: 2020-09-27 | Disposition: A | Discharge status: Home / Self Care | Payer: 59 | Source: Ambulatory Visit | Attending: Obstetrics and Gynecology | Admitting: Obstetrics and Gynecology

## 2020-09-27 DIAGNOSIS — N6310 Unspecified lump in the right breast, unspecified quadrant: Secondary | ICD-10-CM | POA: Diagnosis present

## 2020-09-27 DIAGNOSIS — R928 Other abnormal and inconclusive findings on diagnostic imaging of breast: Secondary | ICD-10-CM | POA: Diagnosis present

## 2021-10-10 ENCOUNTER — Other Ambulatory Visit: Payer: Self-pay | Admitting: Obstetrics and Gynecology

## 2021-10-10 DIAGNOSIS — Z1231 Encounter for screening mammogram for malignant neoplasm of breast: Secondary | ICD-10-CM

## 2021-10-31 ENCOUNTER — Other Ambulatory Visit: Payer: Self-pay

## 2021-10-31 ENCOUNTER — Ambulatory Visit
Admission: RE | Admit: 2021-10-31 | Discharge: 2021-10-31 | Disposition: A | Discharge status: Home / Self Care | Payer: 59 | Source: Ambulatory Visit | Attending: Obstetrics and Gynecology | Admitting: Obstetrics and Gynecology

## 2021-10-31 DIAGNOSIS — Z1231 Encounter for screening mammogram for malignant neoplasm of breast: Secondary | ICD-10-CM | POA: Insufficient documentation

## 2021-11-10 ENCOUNTER — Other Ambulatory Visit: Payer: Self-pay

## 2021-11-10 ENCOUNTER — Ambulatory Visit
Admission: EM | Admit: 2021-11-10 | Discharge: 2021-11-10 | Disposition: A | Discharge status: Home / Self Care | Payer: 59 | Attending: Emergency Medicine | Admitting: Emergency Medicine

## 2021-11-10 ENCOUNTER — Encounter: Payer: Self-pay | Admitting: Emergency Medicine

## 2021-11-10 DIAGNOSIS — J36 Peritonsillar abscess: Secondary | ICD-10-CM

## 2021-11-10 LAB — GROUP A STREP BY PCR: Group A Strep by PCR: NOT DETECTED

## 2021-11-10 MED ORDER — PREDNISONE 10 MG (21) PO TBPK: ORAL_TABLET | ORAL | 0 refills | Status: AC

## 2021-11-10 MED ORDER — CEFDINIR 300 MG PO CAPS: 300.0000 mg | ORAL_CAPSULE | Freq: Two times a day (BID) | ORAL | 0 refills | Status: AC

## 2021-11-10 NOTE — ED Triage Notes (Signed)
Patient c/o sore throat and swelling that started yesterday.  Patient denies fevers.

## 2021-11-10 NOTE — Discharge Instructions (Addendum)
Your strep test was negative today but with the swelling, redness and pain I am concerned that she may have a developing throat infection.  I [name] treat you with cefdinir twice daily for 10 days.  Please take it with food for peritonsillar cellulitis.  Start the prednisone pack this morning and take it according to the package instructions.  Take it with food and always take it in the morning.  This will help with swelling and pain.  Continue to monitor symptoms and if develop worsening pain, worsening swelling where the right side of your throat crosses to the midline as we discussed, fevers, difficulty swallowing, or difficulty breathing you need to go to the ER for evaluation.

## 2021-11-10 NOTE — ED Provider Notes (Signed)
[facility]    CSN: [id number] Arrival date & time: 11/10/21  0801      History   Chief Complaint Chief Complaint  Patient presents with   Sore Throat    HPI [name] is a 45 y.o. female.   HPI  45 year old female here for evaluation of sore throat.  Patient reports that she developed pain on the right side of her throat yesterday with a feeling of swelling on the right side and painful swallowing.  Patient is also experiencing pain in her right ear.  She denies fever, runny nose nasal congestion, cough, shortness of breath or wheezing, GI complaints, body aches, or headache.  She is unaware of any sick contacts.  Past Medical History:  Diagnosis Date   Anemia    Breast cyst, left    benign   Pre-diabetes    diet controlled   Seizures ([place])    Age 5 to 45 years old    Patient Active Problem List   Diagnosis Date Noted   Abnormal Pap smear of cervix 08/10/2020   Dysmenorrhea 08/10/2020   Left ovarian cyst 08/10/2020   Menorrhagia with regular cycle 08/10/2020   Prediabetes 08/10/2020   Anterior chest wall pain 01/21/2018   Post-operative state 06/05/2017   Irregular menstrual cycle 03/08/2014   Other and unspecified ovarian cyst 03/08/2014    Past Surgical History:  Procedure Laterality Date   ABDOMINAL HYSTERECTOMY     BREAST CYST ASPIRATION Left 2018   CESAREAN SECTION     2003, and 2008   HYSTERECTOMY ABDOMINAL WITH SALPINGECTOMY Bilateral 06/05/2017   Procedure: HYSTERECTOMY ABDOMINAL WITH SALPINGECTOMY;  Surgeon: Boykin Nearing, MD;  Location: [facility] ORS;  Service: Gynecology;  Laterality: Bilateral;   TUBAL LIGATION      OB History   No obstetric history on file.      Home Medications    Prior to Admission medications   Medication Sig Start Date End Date Taking? Authorizing Provider  cefdinir (OMNICEF) 300 MG capsule Take 1 capsule (300 mg total) by mouth 2 (two) times daily for 10 days. 11/10/21 11/20/21 Yes  Margarette Canada, NP  predniSONE (STERAPRED UNI-PAK 21 TAB) 10 MG (21) TBPK tablet Take 6 tablets on day 1, 5 tablets day 2, 4 tablets day 3, 3 tablets day 4, 2 tablets day 5, 1 tablet day 6 11/10/21  Yes Margarette Canada, NP    Family History Family History  Problem Relation Age of Onset   Breast cancer Maternal Aunt        past menop.   Hypertension Mother    Diabetes Father    Cancer Paternal Grandfather     Social History Social History   Tobacco Use   Smoking status: Never   Smokeless tobacco: Never  Vaping Use   Vaping Use: Never used  Substance Use Topics   Alcohol use: No   Drug use: No     Allergies   Patient has no known allergies.   Review of Systems Review of Systems  Constitutional:  Negative for activity change, appetite change and fever.  HENT:  Positive for ear pain and sore throat. Negative for congestion and rhinorrhea.   Respiratory:  Negative for cough, shortness of breath and wheezing.   Gastrointestinal:  Negative for diarrhea, nausea and vomiting.  Musculoskeletal:  Negative for arthralgias and myalgias.  Skin:  Negative for rash.  Neurological:  Negative for headaches.  Hematological: Negative.   Psychiatric/Behavioral: Negative.      Physical  Exam Triage Vital Signs ED Triage Vitals  Enc Vitals Group     BP --      Pulse --      Resp --      Temp --      Temp src --      SpO2 --      Weight 11/10/21 0808 135 lb (61.2 kg)     Height 11/10/21 0808 5' 3.5" (1.613 m)     Head Circumference --      Peak Flow --      Pain Score 11/10/21 0807 8     Pain Loc --      Pain Edu? --      Excl. in [place]? --    No data found.  Updated Vital Signs BP 133/62 (BP Location: Right Arm)   Pulse (!) 102   Temp 98 F (36.7 C) (Oral)   Ht 5' 3.5" (1.613 m)   Wt 135 lb (61.2 kg)   LMP 05/31/2017 (Exact Date) Comment: scheduled hysterectomy on 06/05/2017  SpO2 100%   BMI 23.54 kg/m   Visual Acuity Right Eye Distance:   Left Eye Distance:    Bilateral Distance:    Right Eye Near:   Left Eye Near:    Bilateral Near:     Physical Exam Vitals and nursing note reviewed.  Constitutional:      General: She is not in acute distress.    Appearance: Normal appearance. She is normal weight. She is not ill-appearing.  HENT:     Head: Normocephalic and atraumatic.     Right Ear: External ear normal. There is impacted cerumen.     Left Ear: Tympanic membrane, ear canal and external ear normal. There is no impacted cerumen.     Nose: Congestion and rhinorrhea present.     Mouth/Throat:     Mouth: Mucous membranes are moist.     Pharynx: Oropharynx is clear. Posterior oropharyngeal erythema present. No oropharyngeal exudate.  Cardiovascular:     Rate and Rhythm: Normal rate and regular rhythm.     Pulses: Normal pulses.     Heart sounds: Normal heart sounds. No murmur heard.   No gallop.  Pulmonary:     Effort: Pulmonary effort is normal.     Breath sounds: Normal breath sounds. No stridor. No wheezing, rhonchi or rales.  Musculoskeletal:     Cervical back: Normal range of motion and neck supple. Tenderness present.  Lymphadenopathy:     Cervical: Cervical adenopathy present.  Skin:    General: Skin is warm and dry.     Capillary Refill: Capillary refill takes less than 2 seconds.     Findings: No erythema or rash.  Neurological:     General: No focal deficit present.     Mental Status: She is alert and oriented to person, place, and time.  Psychiatric:        Mood and Affect: Mood normal.        Behavior: Behavior normal.        Thought Content: Thought content normal.        Judgment: Judgment normal.     UC Treatments / Results  Labs (all labs ordered are listed, but only abnormal results are displayed) Labs Reviewed  GROUP A STREP BY PCR    EKG   Radiology No results found.  Procedures Procedures (including critical care time)  Medications Ordered in UC Medications - No data to display  Initial  Impression / Assessment and  Plan / UC Course  I have reviewed the triage vital signs and the nursing notes.  Pertinent labs & imaging results that were available during my care of the patient were reviewed by me and considered in my medical decision making (see chart for details).  Patient is a very pleasant, nontoxic-appearing 45 year old female here for evaluation of right-sided sore throat, feeling of right-sided throat swelling, and pain in her right ear that started yesterday.  There is no associated fever or upper respiratory symptoms.  Physical exam reveals a pearly gray tympanic membrane on the left with a normal light reflex and clear external auditory canal.  The right TM is occluded by cerumen.  The external auditory canal is otherwise clear.  Nasal mucosa is mildly erythematous and edematous with clear discharge in both nares.  Oropharyngeal exam reveals erythema to both tonsillar pillars with 2+ edema on the right and 1+ on the left.  No exudate appreciated.  Posterior oropharynx is unremarkable.  Patient does have right-sided cervical lymphadenopathy with nonpalpable on the left.  No stridor when auscultating over the trachea and lung sounds are clear to auscultation all fields.  Strep patient was collected at triage.  I advised the patient that I am more suspicious she may be developing a peritonsillar abscess though I do not feel any fullness along the jawline in the submental or submandibular space.  I also do not palpate any submental or submandibular lymph nodes.  No trismus appreciated on exam.  I have advised patient that I will start her on antibiotics regardless of what the strep PCR shows and have given her ER precautions to include increasing pain, swelling, swelling that comes to her uvula, or difficulty breathing.  Strep PCR is negative.  Will treat patient for peritonsillar cellulitis with cefdinir twice daily for 10 days.  We will also give steroid pack to help with inflammation  and pain.  ER precautions reviewed again with the patient.   Final Clinical Impressions(s) / UC Diagnoses   Final diagnoses:  Peritonsillar cellulitis     Discharge Instructions      Your strep test was negative today but with the swelling, redness and pain I am concerned that she may have a developing throat infection.  I Minna treat you with cefdinir twice daily for 10 days.  Please take it with food for peritonsillar cellulitis.  Start the prednisone pack this morning and take it according to the package instructions.  Take it with food and always take it in the morning.  This will help with swelling and pain.  Continue to monitor symptoms and if develop worsening pain, worsening swelling where the right side of your throat crosses to the midline as we discussed, fevers, difficulty swallowing, or difficulty breathing you need to go to the ER for evaluation.     ED Prescriptions     Medication Sig Dispense Auth. Provider   cefdinir (OMNICEF) 300 MG capsule Take 1 capsule (300 mg total) by mouth 2 (two) times daily for 10 days. 20 capsule Margarette Canada, NP   predniSONE (STERAPRED UNI-PAK 21 TAB) 10 MG (21) TBPK tablet Take 6 tablets on day 1, 5 tablets day 2, 4 tablets day 3, 3 tablets day 4, 2 tablets day 5, 1 tablet day 6 21 tablet Margarette Canada, NP      PDMP not reviewed this encounter.   Margarette Canada, NP 11/10/21 [phone]

## 2021-12-09 IMAGING — MG MM DIGITAL SCREENING BILAT W/ TOMO AND CAD
8 series · 8 of 24 positions shown · non-contrast
Comparison: Previous exam(s).

CLINICAL DATA: Screening.

EXAM:
DIGITAL SCREENING BILATERAL MAMMOGRAM WITH TOMOSYNTHESIS AND CAD
TECHNIQUE: Bilateral screening digital craniocaudal and mediolateral oblique
mammograms were obtained. Bilateral screening digital breast
tomosynthesis was performed. The images were evaluated with
computer-aided detection.

[R CC synth-2D]
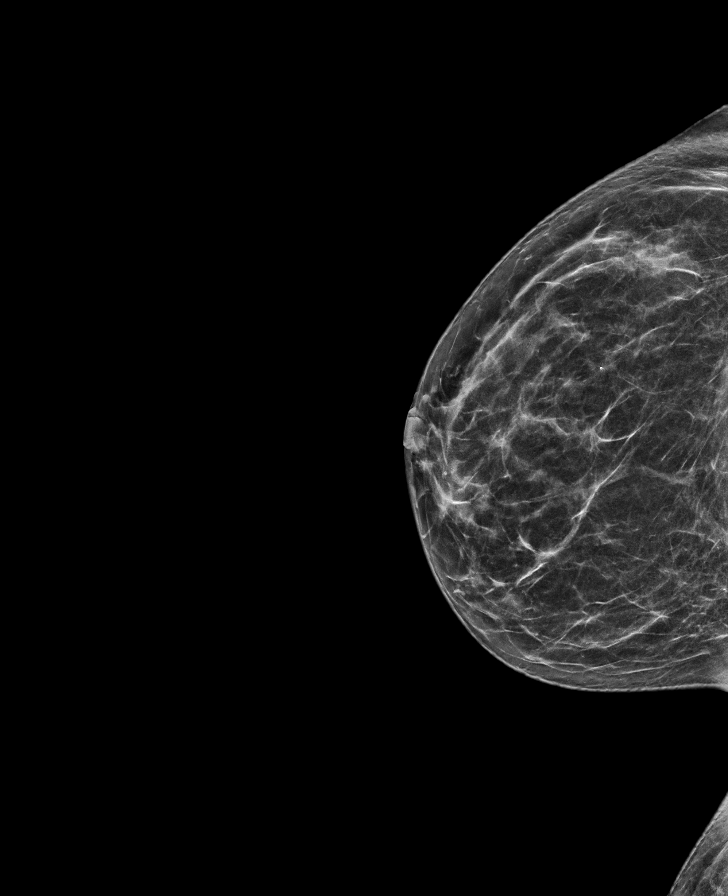

[L CC synth-2D]
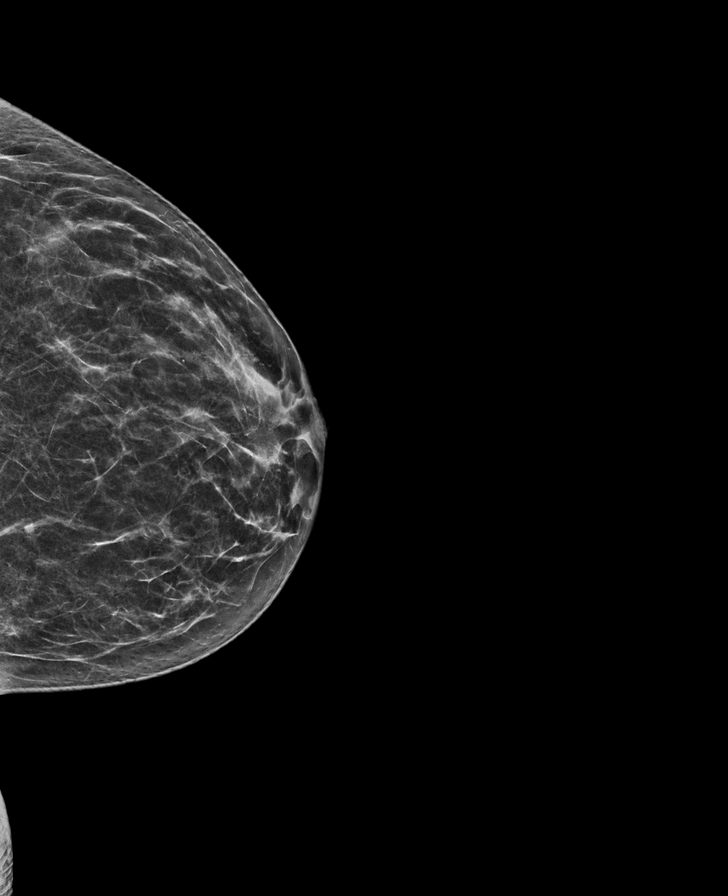

[R MLO synth-2D]
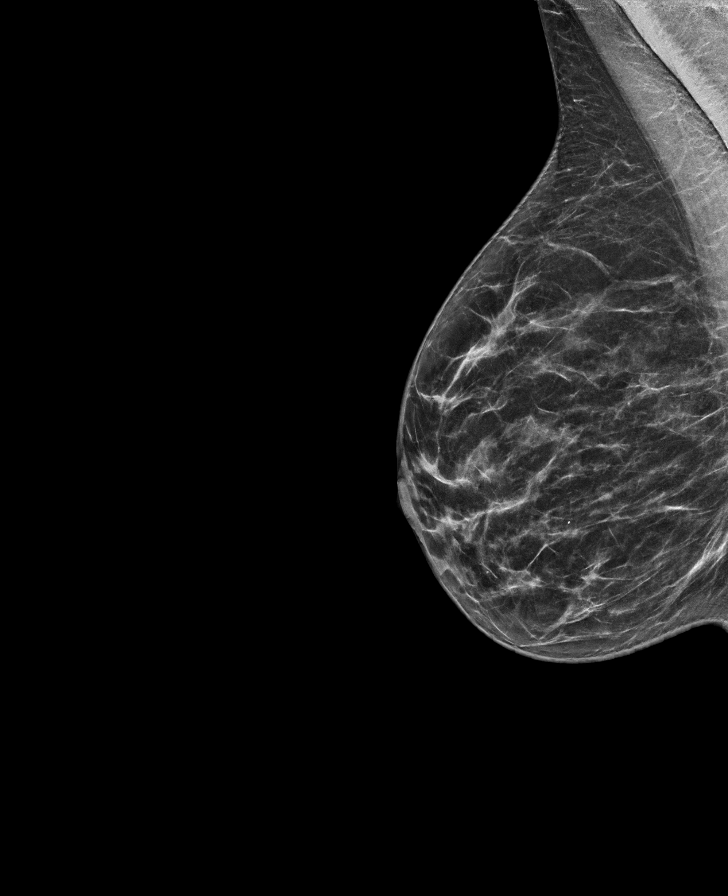

[L MLO synth-2D]
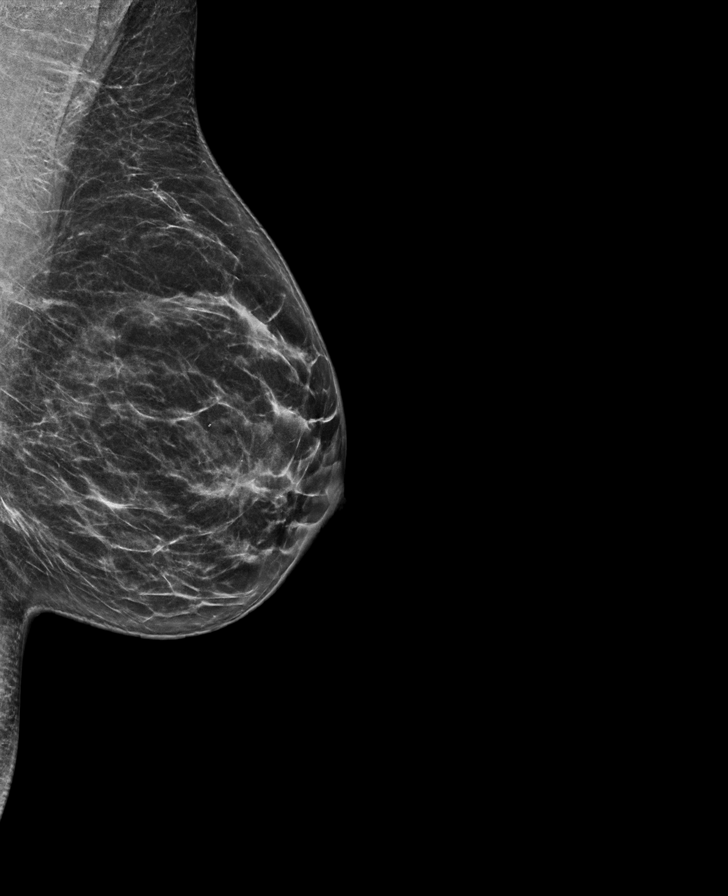

[L MLO tomo · tomo slice 33/65.0]
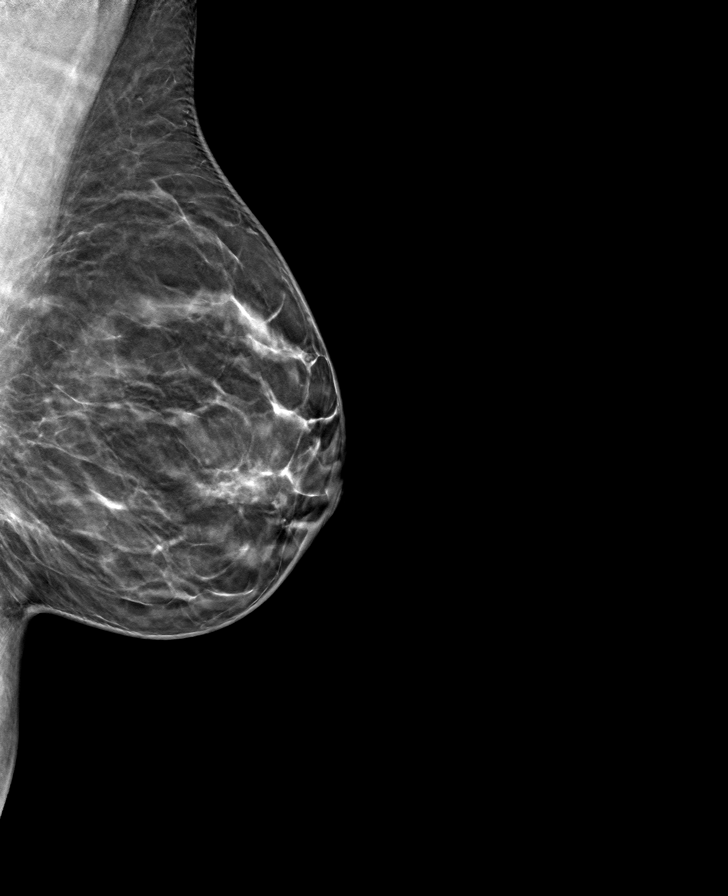

[R MLO tomo · tomo slice 31/62.0]
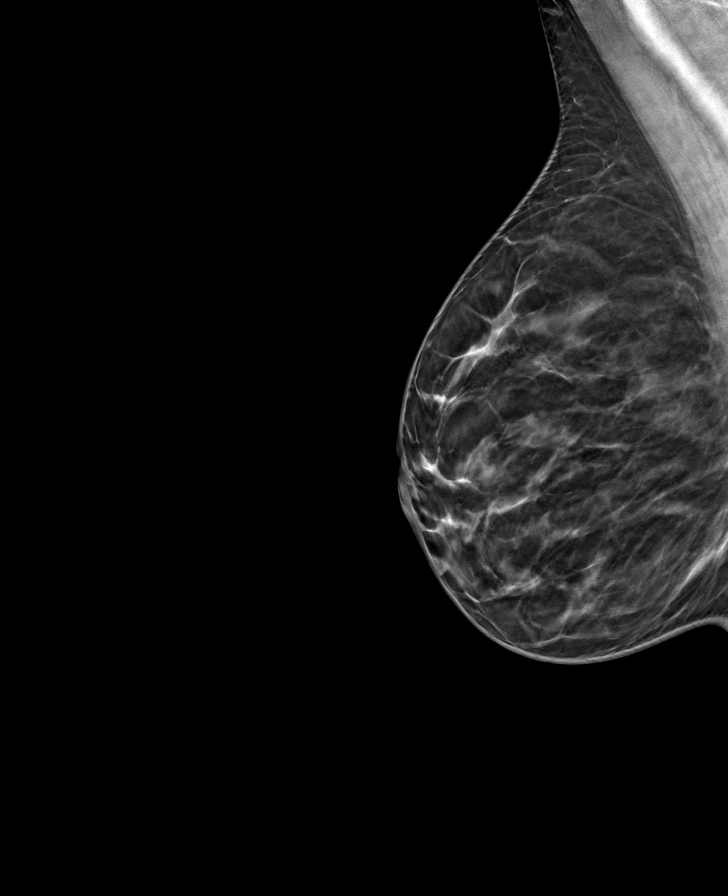

[L CC tomo · tomo slice 31/60.0]
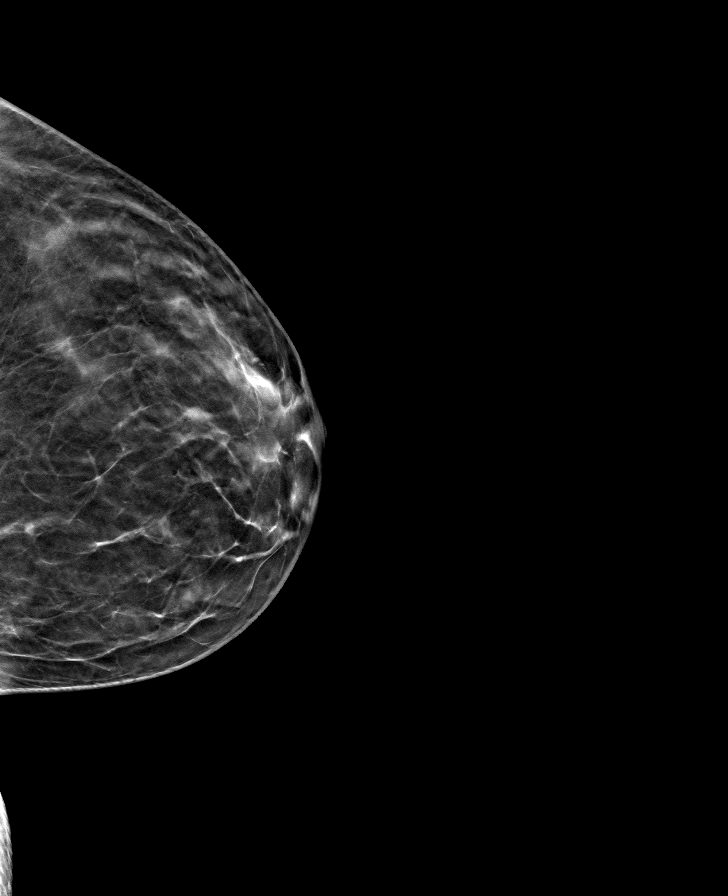

[R CC tomo · tomo slice 31/60.0]
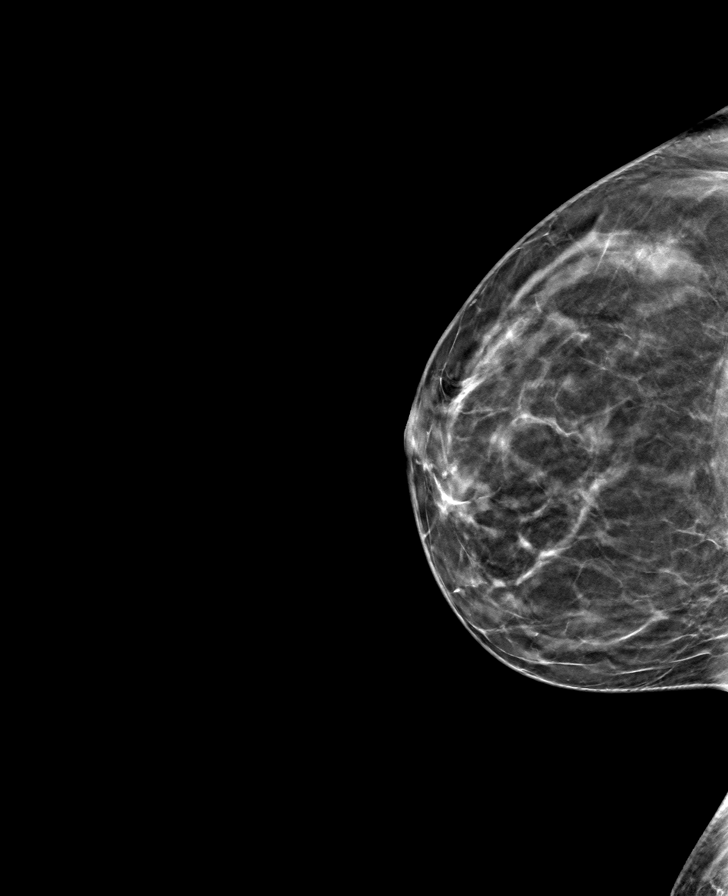

[8 of 24 positions shown; findings below may reference images not displayed]

ACR Breast Density Category b: There are scattered areas of
fibroglandular density.
FINDINGS: There are no findings suspicious for malignancy.
IMPRESSION: No mammographic evidence of malignancy. A result letter of this
screening mammogram will be mailed directly to the patient.

RECOMMENDATION:
Screening mammogram in one year. (Code:51-O-LD2)

BI-RADS CATEGORY  1: Negative.

## 2022-10-22 ENCOUNTER — Other Ambulatory Visit: Payer: Self-pay | Admitting: Obstetrics and Gynecology

## 2022-10-22 DIAGNOSIS — Z1231 Encounter for screening mammogram for malignant neoplasm of breast: Secondary | ICD-10-CM

## 2022-11-25 ENCOUNTER — Ambulatory Visit
Admission: RE | Admit: 2022-11-25 | Discharge: 2022-11-25 | Disposition: A | Discharge status: Home / Self Care | Payer: 59 | Source: Ambulatory Visit | Attending: Obstetrics and Gynecology | Admitting: Obstetrics and Gynecology

## 2022-11-25 DIAGNOSIS — Z1231 Encounter for screening mammogram for malignant neoplasm of breast: Secondary | ICD-10-CM | POA: Insufficient documentation

## 2022-11-27 ENCOUNTER — Other Ambulatory Visit: Payer: Self-pay | Admitting: Obstetrics and Gynecology

## 2022-11-27 DIAGNOSIS — R928 Other abnormal and inconclusive findings on diagnostic imaging of breast: Secondary | ICD-10-CM

## 2022-12-02 ENCOUNTER — Other Ambulatory Visit: Payer: 59

## 2022-12-02 ENCOUNTER — Ambulatory Visit
Admission: RE | Admit: 2022-12-02 | Discharge: 2022-12-02 | Disposition: A | Discharge status: Home / Self Care | Payer: 59 | Source: Ambulatory Visit | Attending: Obstetrics and Gynecology | Admitting: Obstetrics and Gynecology

## 2022-12-02 DIAGNOSIS — R928 Other abnormal and inconclusive findings on diagnostic imaging of breast: Secondary | ICD-10-CM | POA: Insufficient documentation

## 2022-12-12 ENCOUNTER — Other Ambulatory Visit: Payer: 59

## 2023-02-02 ENCOUNTER — Ambulatory Visit
Admission: EM | Admit: 2023-02-02 | Discharge: 2023-02-02 | Disposition: A | Discharge status: Home / Self Care | Payer: BC Managed Care – PPO | Attending: Emergency Medicine | Admitting: Emergency Medicine

## 2023-02-02 ENCOUNTER — Ambulatory Visit: Payer: Self-pay

## 2023-02-02 DIAGNOSIS — H109 Unspecified conjunctivitis: Secondary | ICD-10-CM

## 2023-02-02 MED ORDER — POLYMYXIN B-TRIMETHOPRIM 10000-0.1 UNIT/ML-% OP SOLN
1.0000 [drp] | OPHTHALMIC | 0 refills | Status: AC
Start: 2023-02-02 — End: ?

## 2023-02-02 NOTE — ED Triage Notes (Signed)
Pt c/o left eye pain reddness and drainage that started yesterday

## 2023-02-02 NOTE — Discharge Instructions (Addendum)
Today you being treated for bacterial conjunctivitis.   Place one drop of polytrim into the effected eye every 4 hours while awake for 7 days. If the other eye starts to have symptoms you may use medication in it as well. Do not allow tip of dropper to touch eye.  May use cool compress for comfort and to remove discharge if present. Pat the eye, do not wipe.  Dispose of most recently use contacts wear glasses until symptoms have resolved.   Do not rub eyes, this may cause more irritation.  May use Claritin, Zyrtec or benadryl as needed to help if itching present.  Please avoid use of eye makeup until symptoms clear.  If symptoms persist after use of medication, please follow up at Urgent Care or with ophthalmologist (eye doctor)

## 2023-02-02 NOTE — ED Provider Notes (Signed)
[facility]    CSN: [id number] Arrival date & time: 02/02/23  0854      History   Chief Complaint Chief Complaint  Patient presents with   Eye Problem    Maybe pink eye or infection - Entered by patient   Eye Pain    HPI [name] is a 47 y.o. female.   Patient presents with left eye erythema, pain, drainage and pruritus beginning 1 day ago.  Experiencing mild blurriness most prominently in drainage is present.  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  Wears glasses, last use of contacts 4 days prior.  Denies light sensitivity.  Has attempted use of cool compresses.    Past Medical History:  Diagnosis Date   Anemia    Breast cyst, left    benign   Pre-diabetes    diet controlled   Seizures ([place])    Age 63 to 47 years old    Patient Active Problem List   Diagnosis Date Noted   Abnormal Pap smear of cervix 08/10/2020   Dysmenorrhea 08/10/2020   Left ovarian cyst 08/10/2020   Menorrhagia with regular cycle 08/10/2020   Prediabetes 08/10/2020   Anterior chest wall pain 01/21/2018   Post-operative state 06/05/2017   Irregular menstrual cycle 03/08/2014   Other and unspecified ovarian cyst 03/08/2014    Past Surgical History:  Procedure Laterality Date   ABDOMINAL HYSTERECTOMY     BREAST CYST ASPIRATION Left 2018   CESAREAN SECTION     2003, and 2008   HYSTERECTOMY ABDOMINAL WITH SALPINGECTOMY Bilateral 06/05/2017   Procedure: HYSTERECTOMY ABDOMINAL WITH SALPINGECTOMY;  Surgeon: Boykin Nearing, MD;  Location: [facility] OR[facility];  Service: Gynecology;  Laterality: Bilateral;   TUBAL LIGATION      OB History   No obstetric history on file.      Home Medications    Prior to Admission medications   Medication Sig Start Date End Date Taking? Authorizing Provider  predniSONE (STERAPRED UNI-PAK 21 TAB) 10 MG (21) TBPK tablet Take 6 tablets on day 1, 5 tablets day 2, 4 tablets day 3, 3 tablets day 4, 2 tablets day 5, 1 tablet day 6 11/10/21    Margarette Canada, NP    Family History Family History  Problem Relation Age of Onset   Breast cancer Maternal Aunt        past menop.   Hypertension Mother    Diabetes Father    Cancer Paternal Grandfather     Social History Social History   Tobacco Use   Smoking status: Never   Smokeless tobacco: Never  Vaping Use   Vaping Use: Never used  Substance Use Topics   Alcohol use: No   Drug use: No     Allergies   Patient has no known allergies.   Review of Systems Review of Systems  Eyes:  Positive for pain, discharge, redness and itching. Negative for photophobia and visual disturbance.  Respiratory: Negative.    Cardiovascular: Negative.   Skin: Negative.      Physical Exam Triage Vital Signs ED Triage Vitals  Enc Vitals Group     BP 02/02/23 0936 119/76     Pulse Rate 02/02/23 0936 67     Resp 02/02/23 0936 20     Temp 02/02/23 0936 98.5 F (36.9 C)     Temp Source 02/02/23 0936 Oral     SpO2 02/02/23 0936 98 %     Weight --      Height --  Head Circumference --      Peak Flow --      Pain Score 02/02/23 0937 6     Pain Loc --      Pain Edu? --      Excl. in [place]? --    No data found.  Updated Vital Signs BP 119/76   Pulse 67   Temp 98.5 F (36.9 C) (Oral)   Resp 20   LMP 05/31/2017 (Exact Date) Comment: scheduled hysterectomy on 06/05/2017  SpO2 98%   Visual Acuity Right Eye Distance:   Left Eye Distance:   Bilateral Distance:    Right Eye Near:   Left Eye Near:    Bilateral Near:     Physical Exam Constitutional:      Appearance: Normal appearance.  Eyes:     Comments:  erythema present to the left conjunctiva with mild periorbital swelling, no drainage on exam, vision is grossly intact, extraocular movements intact  Pulmonary:     Effort: Pulmonary effort is normal.  Neurological:     Mental Status: She is alert and oriented to person, place, and time.      UC Treatments / Results  Labs (all labs ordered are listed, but  only abnormal results are displayed) Labs Reviewed - No data to display  EKG   Radiology No results found.  Procedures Procedures (including critical care time)  Medications Ordered in UC Medications - No data to display  Initial Impression / Assessment and Plan / UC Course  I have reviewed the triage vital signs and the nursing notes.  Pertinent labs & imaging results that were available during my care of the patient were reviewed by me and considered in my medical decision making (see chart for details).  Bacterial conjunctivitis of left eye  Presentation is consistent with above diagnosis, discussed with patient, Polytrim prescribed and discussed administration, recommended supportive measures for additional management, may follow-up with urgent care as needed Final Clinical Impressions(s) / UC Diagnoses   Final diagnoses:  None   Discharge Instructions   None    ED Prescriptions   None    PDMP not reviewed this encounter.   Hans [place], [place] 02/02/23 [phone]

## 2023-06-02 ENCOUNTER — Ambulatory Visit: Payer: BC Managed Care – PPO

## 2023-06-02 DIAGNOSIS — Z1211 Encounter for screening for malignant neoplasm of colon: Secondary | ICD-10-CM

## 2023-10-28 ENCOUNTER — Other Ambulatory Visit: Payer: Self-pay | Admitting: Obstetrics and Gynecology

## 2023-10-28 DIAGNOSIS — Z1231 Encounter for screening mammogram for malignant neoplasm of breast: Secondary | ICD-10-CM

## 2023-11-27 ENCOUNTER — Ambulatory Visit
Admission: RE | Admit: 2023-11-27 | Discharge: 2023-11-27 | Disposition: A | Discharge status: Home / Self Care | Payer: BC Managed Care – PPO | Source: Ambulatory Visit | Attending: Obstetrics and Gynecology | Admitting: Obstetrics and Gynecology

## 2023-11-27 DIAGNOSIS — Z1231 Encounter for screening mammogram for malignant neoplasm of breast: Secondary | ICD-10-CM | POA: Diagnosis present

## 2024-12-26 ENCOUNTER — Other Ambulatory Visit: Payer: Self-pay | Admitting: Obstetrics and Gynecology

## 2024-12-26 DIAGNOSIS — Z1231 Encounter for screening mammogram for malignant neoplasm of breast: Secondary | ICD-10-CM

## 2025-01-25 ENCOUNTER — Inpatient Hospital Stay
Admission: RE | Admit: 2025-01-25 | Discharge: 2025-01-25 | Payer: Self-pay | Attending: Obstetrics and Gynecology | Admitting: Obstetrics and Gynecology

## 2025-01-25 DIAGNOSIS — Z1231 Encounter for screening mammogram for malignant neoplasm of breast: Secondary | ICD-10-CM
